# Patient Record
Sex: Female | Born: 1990 | Race: Black or African American | Hispanic: No | Marital: Single | Smoking: Never smoker
Health system: Southern US, Community
[De-identification: ages and names within clinical notes are randomized; demographics above are authoritative.]

## PROBLEM LIST (undated history)

## (undated) DIAGNOSIS — F32A Depression, unspecified: Secondary | ICD-10-CM

## (undated) DIAGNOSIS — N83209 Unspecified ovarian cyst, unspecified side: Secondary | ICD-10-CM

## (undated) DIAGNOSIS — K219 Gastro-esophageal reflux disease without esophagitis: Secondary | ICD-10-CM

## (undated) DIAGNOSIS — F419 Anxiety disorder, unspecified: Secondary | ICD-10-CM

## (undated) DIAGNOSIS — R87619 Unspecified abnormal cytological findings in specimens from cervix uteri: Secondary | ICD-10-CM

## (undated) DIAGNOSIS — D649 Anemia, unspecified: Secondary | ICD-10-CM

## (undated) DIAGNOSIS — J45909 Unspecified asthma, uncomplicated: Secondary | ICD-10-CM

## (undated) HISTORY — DX: Unspecified ovarian cyst, unspecified side: N83.209

## (undated) HISTORY — DX: Anemia, unspecified: D64.9

## (undated) HISTORY — PX: CHOLECYSTECTOMY: SHX55

## (undated) HISTORY — DX: Anxiety disorder, unspecified: F41.9

## (undated) HISTORY — PX: COLPOSCOPY: SHX161

## (undated) HISTORY — PX: TUBAL LIGATION: SHX77

## (undated) HISTORY — DX: Depression, unspecified: F32.A

## (undated) HISTORY — DX: Unspecified asthma, uncomplicated: J45.909

## (undated) HISTORY — DX: Unspecified abnormal cytological findings in specimens from cervix uteri: R87.619

---

## 2012-01-16 ENCOUNTER — Other Ambulatory Visit: Payer: Self-pay

## 2012-01-29 ENCOUNTER — Other Ambulatory Visit (HOSPITAL_COMMUNITY): Payer: Self-pay | Admitting: Obstetrics and Gynecology

## 2012-01-29 DIAGNOSIS — Z3689 Encounter for other specified antenatal screening: Secondary | ICD-10-CM

## 2012-01-29 DIAGNOSIS — Z3682 Encounter for antenatal screening for nuchal translucency: Secondary | ICD-10-CM

## 2012-02-01 ENCOUNTER — Encounter (HOSPITAL_COMMUNITY): Payer: Self-pay | Admitting: Obstetrics and Gynecology

## 2012-02-15 ENCOUNTER — Encounter (HOSPITAL_COMMUNITY): Payer: Self-pay

## 2012-02-15 ENCOUNTER — Ambulatory Visit (HOSPITAL_COMMUNITY)
Admission: RE | Admit: 2012-02-15 | Discharge: 2012-02-15 | Disposition: A | Discharge status: Home / Self Care | Payer: 59 | Source: Ambulatory Visit | Attending: Obstetrics and Gynecology | Admitting: Obstetrics and Gynecology

## 2012-02-15 ENCOUNTER — Other Ambulatory Visit: Payer: Self-pay

## 2012-02-15 ENCOUNTER — Other Ambulatory Visit (HOSPITAL_COMMUNITY): Payer: Self-pay

## 2012-02-15 VITALS — BP 123/72 | HR 85 | Wt 236.2 lb

## 2012-02-15 DIAGNOSIS — O3510X0 Maternal care for (suspected) chromosomal abnormality in fetus, unspecified, not applicable or unspecified: Secondary | ICD-10-CM | POA: Insufficient documentation

## 2012-02-15 DIAGNOSIS — O351XX0 Maternal care for (suspected) chromosomal abnormality in fetus, not applicable or unspecified: Secondary | ICD-10-CM | POA: Insufficient documentation

## 2012-02-15 DIAGNOSIS — Z3689 Encounter for other specified antenatal screening: Secondary | ICD-10-CM | POA: Insufficient documentation

## 2012-02-15 DIAGNOSIS — Z1389 Encounter for screening for other disorder: Secondary | ICD-10-CM

## 2012-02-15 DIAGNOSIS — Z3682 Encounter for antenatal screening for nuchal translucency: Secondary | ICD-10-CM

## 2012-02-15 DIAGNOSIS — O352XX0 Maternal care for (suspected) hereditary disease in fetus, not applicable or unspecified: Secondary | ICD-10-CM | POA: Insufficient documentation

## 2012-02-15 NOTE — Progress Notes (Signed)
Patient seen today  for ultrasound appointment.  See full report in [facility].  Alpha Gula, MD  Single IUP at 12 4/7 weels First trimester screen performed NT of 1.6 mm noted.  Nasal bone visualied.  Recommend follow up ultrasound in 6 weeks for detailed anatomy scan.

## 2012-02-16 ENCOUNTER — Encounter (HOSPITAL_COMMUNITY): Payer: Self-pay

## 2012-02-16 NOTE — Progress Notes (Signed)
Genetic Counseling  High-Risk Gestation Note  Appointment Date:  02/15/2012 Referred By: Victoria Hamming, MD Date of Birth:  September 14, 1990    Pregnancy History: G1P0 Estimated Date of Delivery: 08/25/12 Estimated Gestational Age: [redacted]w[redacted]d Attending: Alpha Gula, MD  I met with Ms. Victoria Smith and her partner, Mr. Victoria Smith, for genetic counseling because of a family history of Down syndrome.  We began by reviewing both family histories in detail. Ms. Victoria Smith reported a brother with Down syndrome. She reported that he is in his 41s, is a slow Advice worker and lives with their father. She reported that he has no physical differences compared to relatives and does not have facial features characteristic of Down syndrome. Ms. Victoria Smith reported that he possibly had surgery in the past due to a tumor in his ear, but he is otherwise healthy. She reported that her father also has somewhat slower mental functioning describing that he often repeats things during conversations. We discussed that these reported features do not fit the typical characteristics of individuals with Down syndrome. Thus, we discussed that Ms. Victoria Smith's brother may have mental retardation due to a different underlying cause.   We discussed that 95% of cases of Down syndrome are not inherited and are the result of non-disjunction.  Three to 4% of cases of Down syndrome are the result of a translocation involving chromosome #21.  We discussed the option of chromosome analysis to determine if an individual is a carrier of a balanced translocation involving chromosome #21.  If an individual carries a balanced translocation involving chromosome #21, then the chance to have a baby with Down syndrome would be greater than the maternal age-related risk. If Ms. Victoria Smith's brother has Down syndrome, then it is most likely sporadic, which would not increase recurrence risk for the current pregnancy. However, additional information regarding his karyotype  analysis or karyotype analysis for the patient would further refine recurrence risk assessment. We reviewed that in the case that Ms. Victoria Smith's brother does not have Down syndrome but mental retardation due to a different etiology, then the current pregnancy would not be at increased risk for Down syndrome given the patient's age alone.   We discussed that there are many different causes of mental retardation such as genetic differences, sporadic causes, or injuries.  A specific diagnosis for mental retardation can be determined in approximately 50% of cases.  In the remaining 50% of cases, a diagnosis may not ever be determined.  Without more specific information, potential genetic risks cannot be determined.  We offered to review medical records for her brother regarding the specific diagnosis and underlying etiology, if known, in order to more accurate assess recurrence risk for the current pregnancy. If an underlying etiology has not been determined for her brother's features, a medical genetics evaluation would be available to him to assess for an etiology and assess recurrence risk for relatives. The patient understands that in the absence of a confirmed underlying genetic cause, prenatal testing would not be available.   We reviewed available screening options for Down syndrome.  Regarding screening tests, we discussed the options of First screen and ultrasound.  She understands that screening tests are used to modify a patient's a priori risk for aneuploidy, typically based on age.  This estimate provides a pregnancy specific risk assessment.  We discussed the risks, limitations, and benefits of each.  After reviewing these options, Ms. Victoria Smith elected to have First Trimester screening at the time of today's visit. Complete ultrasound results  reported separately.   She understands that ultrasound cannot rule out all birth defects or genetic syndromes.   Ms. Victoria Smith also reported a female maternal  first cousin who is mentally slower than relatives due to a history of seizures. An underlying cause for her seizures was not known by the patient at the time of today's visit. Epilepsy occurs in approximately 1% of the population and can have many causes.  Approximately 80% of epilepsy is thought to be idiopathic while the remaining 20% is secondary to a variety of factors such as perinatal events, infections, trauma and genetic disease.  A specific diagnosis in an affected individual is necessary to accurately assess the risk for other family members to develop epilepsy.  In the absence of a known etiology, epilepsy is thought to be caused by a combination of genetic and environmental factors, called multifactorial inheritance. In the case of multifactorial inheritance, recurrence risk for epilepsy would not likely be increased above the general population risk for the current pregnancy given the degree of relation. Without further information regarding the provided family history, an accurate genetic risk cannot be calculated. Further genetic counseling is warranted if more information is obtained.  Ms. Victoria Smith was provided with written information regarding sickle cell anemia (SCA) including the carrier frequency and incidence in the African-American population, the availability of carrier testing and prenatal diagnosis if indicated.  In addition, we discussed that hemoglobinopathies are routinely screened for as part of the [place] newborn screening panel.  Her OB medical records indicated that hemoglobin electrophoresis was performed on 01/17/12. Results were not available at the time of the patient's visit.    Ms. Victoria Smith denied exposure to environmental toxins or chemical agents. She denied the use of alcohol, tobacco or street drugs. She denied significant viral illnesses during the course of her pregnancy. Her medical and surgical histories were noncontributory.   I counseled this couple  regarding the above risks and available options.  The approximate face-to-face time with the genetic counselor was 25 minutes.  Quinn Plowman, MS Certified Genetic Counselor 02/16/2012

## 2012-04-01 ENCOUNTER — Ambulatory Visit (HOSPITAL_COMMUNITY)
Admission: RE | Admit: 2012-04-01 | Discharge: 2012-04-01 | Disposition: A | Discharge status: Home / Self Care | Payer: 59 | Source: Ambulatory Visit | Attending: Obstetrics and Gynecology | Admitting: Obstetrics and Gynecology

## 2012-04-01 VITALS — BP 122/70 | HR 92 | Wt 237.0 lb

## 2012-04-01 DIAGNOSIS — O358XX0 Maternal care for other (suspected) fetal abnormality and damage, not applicable or unspecified: Secondary | ICD-10-CM | POA: Insufficient documentation

## 2012-04-01 DIAGNOSIS — Z1389 Encounter for screening for other disorder: Secondary | ICD-10-CM | POA: Insufficient documentation

## 2012-04-01 DIAGNOSIS — Z3689 Encounter for other specified antenatal screening: Secondary | ICD-10-CM

## 2012-04-01 DIAGNOSIS — E669 Obesity, unspecified: Secondary | ICD-10-CM | POA: Insufficient documentation

## 2012-04-01 DIAGNOSIS — Z363 Encounter for antenatal screening for malformations: Secondary | ICD-10-CM | POA: Insufficient documentation

## 2012-04-01 NOTE — Progress Notes (Signed)
Victoria Smith  was seen today for an ultrasound appointment.  See full report in [facility].  Alpha Gula, MD  Single IUP at 19 1/7 weeks Normal anatomic fetal survey; however, limited views of the fetal heart and face were obtained No markers associated with Down syndrome noted Normal amniotic fluid volume  Recommend follow up ultrasound in 4 weeks to reevaluate the fetal heart and face.

## 2012-04-29 ENCOUNTER — Ambulatory Visit (HOSPITAL_COMMUNITY)
Admission: RE | Admit: 2012-04-29 | Discharge: 2012-04-29 | Disposition: A | Discharge status: Home / Self Care | Payer: 59 | Source: Ambulatory Visit | Attending: Obstetrics and Gynecology | Admitting: Obstetrics and Gynecology

## 2012-04-29 DIAGNOSIS — O358XX0 Maternal care for other (suspected) fetal abnormality and damage, not applicable or unspecified: Secondary | ICD-10-CM | POA: Insufficient documentation

## 2012-04-29 DIAGNOSIS — O99210 Obesity complicating pregnancy, unspecified trimester: Secondary | ICD-10-CM | POA: Insufficient documentation

## 2012-04-29 DIAGNOSIS — E669 Obesity, unspecified: Secondary | ICD-10-CM | POA: Insufficient documentation

## 2012-04-29 DIAGNOSIS — Z1389 Encounter for screening for other disorder: Secondary | ICD-10-CM | POA: Insufficient documentation

## 2012-04-29 DIAGNOSIS — Z363 Encounter for antenatal screening for malformations: Secondary | ICD-10-CM | POA: Insufficient documentation

## 2012-04-29 DIAGNOSIS — Z3689 Encounter for other specified antenatal screening: Secondary | ICD-10-CM

## 2012-04-29 DIAGNOSIS — O352XX0 Maternal care for (suspected) hereditary disease in fetus, not applicable or unspecified: Secondary | ICD-10-CM | POA: Insufficient documentation

## 2012-04-29 NOTE — Progress Notes (Signed)
Ms. Hinger had an ultrasound appointment today.  Please see [facility] report for details.  Comments There is an active singleton fetus with no apparent dysmorphic features on today's routine anatomic re-examination.  The biometry suggests a fetus with an EFW at the approximately 42nd percentile for gestational age.    Impression Active singleton fetus. Normal interval growth. Normal amniotic fluid volume Exam is limited due to fetal position and maternal insonating characteristics Views of the fetal heart (ductal arch and aortic arch), diaphram, and face are suboptimal, warranting follow up evaluation   Recommendations 1. Repeat interval growth assessment and follow up anatomy to complete our survey of the fetal anatomy by ultrasound was scheduled for your patient in 4 weeks. 2. Follow as clinically indicated.  Rogelia Boga, MD, MS, FACOG Assistant Professor Section of Maternal-Fetal Medicine [facility]

## 2012-05-27 ENCOUNTER — Other Ambulatory Visit (HOSPITAL_COMMUNITY): Payer: Self-pay | Admitting: Obstetrics and Gynecology

## 2012-05-27 DIAGNOSIS — O352XX0 Maternal care for (suspected) hereditary disease in fetus, not applicable or unspecified: Secondary | ICD-10-CM

## 2012-05-28 ENCOUNTER — Ambulatory Visit (HOSPITAL_COMMUNITY)
Admission: RE | Admit: 2012-05-28 | Discharge: 2012-05-28 | Disposition: A | Discharge status: Home / Self Care | Payer: 59 | Source: Ambulatory Visit | Attending: Obstetrics and Gynecology | Admitting: Obstetrics and Gynecology

## 2012-05-28 DIAGNOSIS — O99210 Obesity complicating pregnancy, unspecified trimester: Secondary | ICD-10-CM | POA: Insufficient documentation

## 2012-05-28 DIAGNOSIS — E669 Obesity, unspecified: Secondary | ICD-10-CM | POA: Insufficient documentation

## 2012-05-28 DIAGNOSIS — O352XX0 Maternal care for (suspected) hereditary disease in fetus, not applicable or unspecified: Secondary | ICD-10-CM | POA: Insufficient documentation

## 2012-05-28 NOTE — Progress Notes (Signed)
Victoria Smith  was seen today for an ultrasound appointment.  See full report in [facility].  Alpha Gula, MD  Single IUP at 27 2/7 weeks Normal interval anatomy - the fetal survey is now complete Interval growth is appropriate (48th %tile) Normal amniotic fluid volume  Recommend follow up ultrasounds as clinically indicated

## 2014-05-22 ENCOUNTER — Emergency Department (HOSPITAL_BASED_OUTPATIENT_CLINIC_OR_DEPARTMENT_OTHER)
Admission: EM | Admit: 2014-05-22 | Discharge: 2014-05-22 | Disposition: A | Discharge status: Home / Self Care | Payer: Medicaid Other | Attending: Emergency Medicine | Admitting: Emergency Medicine

## 2014-05-22 ENCOUNTER — Emergency Department (HOSPITAL_BASED_OUTPATIENT_CLINIC_OR_DEPARTMENT_OTHER): Payer: Medicaid Other

## 2014-05-22 ENCOUNTER — Encounter (HOSPITAL_BASED_OUTPATIENT_CLINIC_OR_DEPARTMENT_OTHER): Payer: Self-pay | Admitting: Emergency Medicine

## 2014-05-22 DIAGNOSIS — F17200 Nicotine dependence, unspecified, uncomplicated: Secondary | ICD-10-CM | POA: Insufficient documentation

## 2014-05-22 DIAGNOSIS — Z3A08 8 weeks gestation of pregnancy: Secondary | ICD-10-CM | POA: Diagnosis not present

## 2014-05-22 DIAGNOSIS — Z3490 Encounter for supervision of normal pregnancy, unspecified, unspecified trimester: Secondary | ICD-10-CM

## 2014-05-22 DIAGNOSIS — O26891 Other specified pregnancy related conditions, first trimester: Secondary | ICD-10-CM | POA: Diagnosis present

## 2014-05-22 DIAGNOSIS — O99331 Smoking (tobacco) complicating pregnancy, first trimester: Secondary | ICD-10-CM | POA: Diagnosis not present

## 2014-05-22 DIAGNOSIS — N3000 Acute cystitis without hematuria: Secondary | ICD-10-CM

## 2014-05-22 DIAGNOSIS — O2311 Infections of bladder in pregnancy, first trimester: Secondary | ICD-10-CM | POA: Insufficient documentation

## 2014-05-22 LAB — URINALYSIS, ROUTINE W REFLEX MICROSCOPIC
Bilirubin Urine: NEGATIVE
Glucose, UA: NEGATIVE mg/dL
KETONES UR: NEGATIVE mg/dL
Nitrite: NEGATIVE
PROTEIN: NEGATIVE mg/dL
Specific Gravity, Urine: 1.024 (ref 1.005–1.030)
Urobilinogen, UA: 1 mg/dL (ref 0.0–1.0)
pH: 6.5 (ref 5.0–8.0)

## 2014-05-22 LAB — URINE MICROSCOPIC-ADD ON

## 2014-05-22 LAB — HCG, QUANTITATIVE, PREGNANCY: hCG, Beta Chain, Quant, S: 117730 m[IU]/mL — ABNORMAL HIGH (ref <5)

## 2014-05-22 LAB — PREGNANCY, URINE: Preg Test, Ur: POSITIVE — AB

## 2014-05-22 LAB — CBC
HCT: 34.7 % — ABNORMAL LOW (ref 36.0–46.0)
Hemoglobin: 11.8 g/dL — ABNORMAL LOW (ref 12.0–15.0)
MCH: 29.1 pg (ref 26.0–34.0)
MCHC: 34 g/dL (ref 30.0–36.0)
MCV: 85.7 fL (ref 78.0–100.0)
Platelets: 311 K/uL (ref 150–400)
RBC: 4.05 MIL/uL (ref 3.87–5.11)
RDW: 12.8 % (ref 11.5–15.5)
WBC: 6.8 K/uL (ref 4.0–10.5)

## 2014-05-22 LAB — ABO/RH: ABO/RH(D): B POS

## 2014-05-22 MED ORDER — AMOXICILLIN-POT CLAVULANATE 500-125 MG PO TABS: 1.0000 | ORAL_TABLET | Freq: Three times a day (TID) | ORAL | Status: DC

## 2014-05-22 MED ORDER — PRENATAL VITAMIN 27-0.8 MG PO TABS: 1.0000 | ORAL_TABLET | Freq: Every day | ORAL | Status: DC

## 2014-05-22 NOTE — ED Notes (Signed)
Pt returned from US

## 2014-05-22 NOTE — ED Notes (Signed)
MD at bedside. 

## 2014-05-22 NOTE — ED Provider Notes (Signed)
CSN: [id number]     Arrival date & time 05/22/14  0730 History   First MD Initiated Contact with Patient 05/22/14 [phone]     Chief Complaint  Patient presents with  . Abdominal Pain     (Consider location/radiation/quality/duration/timing/severity/associated sxs/prior Treatment) HPI Pt presenting with c/o lower abdominal pain, burning with urination, urgency and frequency.  No fever/chills.  She has also been having ongoing nausea.  She is approx [redacted] weeks pregnant based on her LMP.  She also states this morning she had some bright red blood from vagina which she saw on toilet tissue and on toilet seat.  No blood clots or passage of tissue.  No back pain.  Pt had hx of UTI during pregnancy.  She is G2P1.  No other complications of prior pregnancy.  There are no other associated systemic symptoms, there are no other alleviating or modifying factors.   History reviewed. No pertinent past medical history. Past Surgical History  Procedure Laterality Date  . Cesarean section     Family History  Problem Relation Age of Onset  . Cancer Mother   . Mental retardation Brother    History  Substance Use Topics  . Smoking status: Current Every Day Smoker  . Smokeless tobacco: Not on file  . Alcohol Use: No   OB History   Grav Para Term Preterm Abortions TAB SAB Ect Mult Living   1              Review of Systems ROS reviewed and all otherwise negative except for mentioned in HPI    Allergies  Review of patient's allergies indicates no known allergies.  Home Medications   Prior to Admission medications   Medication Sig Start Date End Date Taking? Authorizing Provider  Prenatal Vit-Fe Fumarate-FA (PRENATAL MULTIVITAMIN) TABS tablet Take 1 tablet by mouth daily at 12 noon.   Yes Historical Provider, MD  amoxicillin-clavulanate (AUGMENTIN) 500-125 MG per tablet Take 1 tablet (500 mg total) by mouth every 8 (eight) hours. 05/22/14   Ethelda ChickMartha K Linker, MD  ondansetron (ZOFRAN) 4 MG tablet   04/18/12   Historical Provider, MD  Prenatal Vit-Fe Fumarate-FA (PRENATAL VITAMIN) 27-0.8 MG TABS Take 1 tablet by mouth daily. 05/22/14   Ethelda ChickMartha K Linker, MD   BP 128/79  Pulse 76  Temp(Src) 98.2 F (36.8 C) (Oral)  Resp 18  Wt 240 lb (108.863 kg)  SpO2 100% Vitals reviewed Physical Exam Physical Examination: General appearance - alert, well appearing, and in no distress Mental status - alert, oriented to person, place, and time Eyes - no conjunctival injection, no scleral icterus Mouth - mucous membranes moist, pharynx normal without lesions Chest - clear to auscultation, no wheezes, rales or rhonchi, symmetric air entry Heart - normal rate, regular rhythm, normal S1, S2, no murmurs, rubs, clicks or gallops Abdomen - soft, nontender, nondistended, no masses or organomegaly Extremities - peripheral pulses normal, no pedal edema, no clubbing or cyanosis Skin - normal coloration and turgor, no rashes  ED Course  Procedures (including critical care time) Labs Review Labs Reviewed  URINALYSIS, ROUTINE W REFLEX MICROSCOPIC - Abnormal; Notable for the following:    APPearance CLOUDY (*)    Hgb urine dipstick MODERATE (*)    Leukocytes, UA MODERATE (*)    All other components within normal limits  PREGNANCY, URINE - Abnormal; Notable for the following:    Preg Test, Ur POSITIVE (*)    All other components within normal limits  HCG, QUANTITATIVE, PREGNANCY - Abnormal;  Notable for the following:    hCG, Beta Chain, Quant, [place] [id number] (*)    All other components within normal limits  CBC - Abnormal; Notable for the following:    Hemoglobin 11.8 (*)    HCT 34.7 (*)    All other components within normal limits  URINE MICROSCOPIC-ADD ON - Abnormal; Notable for the following:    Squamous Epithelial / LPF MANY (*)    Bacteria, UA MANY (*)    All other components within normal limits  ABO/RH    Imaging Review No results found.   EKG Interpretation None      MDM   Final  diagnoses:  Acute cystitis without hematuria  Pregnancy   Pt presents with symptoms of UTI- urine shows + pregnancy and is c/w uti.  Will treat with augmentin based on UTD recommendations.  Pelvic us shows + IUP at 8 weeks and 4 days, with HR 178.  She rh+ so no requirement for rhogam.  Pt encouraged to followup with OB, given rx for prenatal vitamins as well.  Discharged with strict return precautions.  Pt agreeable with plan.    Ethelda ChickMartha K Linker, MD 05/25/14 [phone]

## 2014-05-22 NOTE — ED Notes (Signed)
Pt sts she is [redacted] weeks pregnant and woke up this morning with lower abd pain. She sts that when she went to the bathroom, her urine was cloudy. Pt sts that she had UTI's with last preg.

## 2014-05-22 NOTE — ED Notes (Signed)
Patient transported to Ultrasound 

## 2014-05-22 NOTE — Discharge Instructions (Signed)
Return to the ED with any concerns including worsening abdominal pain, bleeding and soaking more than one pad per hour, fever/chills, vomiting and not able to keep down liquids or antibiotics, decreased level of alertness/lethargy, or any other alarming symptoms  The ultrasound performed today places your pregnancy at 8 weeks, 4 days

## 2014-06-01 ENCOUNTER — Encounter (HOSPITAL_BASED_OUTPATIENT_CLINIC_OR_DEPARTMENT_OTHER): Payer: Self-pay | Admitting: Emergency Medicine

## 2014-06-01 ENCOUNTER — Emergency Department (HOSPITAL_BASED_OUTPATIENT_CLINIC_OR_DEPARTMENT_OTHER)
Admission: EM | Admit: 2014-06-01 | Discharge: 2014-06-01 | Disposition: A | Discharge status: Home / Self Care | Payer: Medicaid Other | Attending: Emergency Medicine | Admitting: Emergency Medicine

## 2014-06-01 DIAGNOSIS — F17210 Nicotine dependence, cigarettes, uncomplicated: Secondary | ICD-10-CM | POA: Diagnosis not present

## 2014-06-01 DIAGNOSIS — O99331 Smoking (tobacco) complicating pregnancy, first trimester: Secondary | ICD-10-CM | POA: Insufficient documentation

## 2014-06-01 DIAGNOSIS — B373 Candidiasis of vulva and vagina: Secondary | ICD-10-CM | POA: Insufficient documentation

## 2014-06-01 DIAGNOSIS — Z3A10 10 weeks gestation of pregnancy: Secondary | ICD-10-CM | POA: Insufficient documentation

## 2014-06-01 DIAGNOSIS — Z792 Long term (current) use of antibiotics: Secondary | ICD-10-CM | POA: Insufficient documentation

## 2014-06-01 DIAGNOSIS — O2391 Unspecified genitourinary tract infection in pregnancy, first trimester: Secondary | ICD-10-CM | POA: Insufficient documentation

## 2014-06-01 DIAGNOSIS — Z3491 Encounter for supervision of normal pregnancy, unspecified, first trimester: Secondary | ICD-10-CM

## 2014-06-01 DIAGNOSIS — B3731 Acute candidiasis of vulva and vagina: Secondary | ICD-10-CM

## 2014-06-01 LAB — URINALYSIS, ROUTINE W REFLEX MICROSCOPIC
Bilirubin Urine: NEGATIVE
Glucose, UA: NEGATIVE mg/dL
Hgb urine dipstick: NEGATIVE
Ketones, ur: NEGATIVE mg/dL
NITRITE: NEGATIVE
PROTEIN: NEGATIVE mg/dL
SPECIFIC GRAVITY, URINE: 1.028 (ref 1.005–1.030)
UROBILINOGEN UA: 1 mg/dL (ref 0.0–1.0)
pH: 7 (ref 5.0–8.0)

## 2014-06-01 LAB — URINE MICROSCOPIC-ADD ON

## 2014-06-01 LAB — WET PREP, GENITAL
Trich, Wet Prep: NONE SEEN
Yeast Wet Prep HPF POC: NONE SEEN

## 2014-06-01 LAB — PREGNANCY, URINE: Preg Test, Ur: POSITIVE — AB

## 2014-06-01 MED ORDER — FLUCONAZOLE 200 MG PO TABS: 200.0000 mg | ORAL_TABLET | Freq: Every day | ORAL | Status: AC

## 2014-06-01 NOTE — ED Notes (Signed)
Here last week and treated for UTI. White discharge and itching after taking the antibiotic.

## 2014-06-01 NOTE — ED Provider Notes (Signed)
CSN: [id number]     Arrival date & time 06/01/14  1132 History   First MD Initiated Contact with Patient 06/01/14 1149     Chief Complaint  Patient presents with  . Vaginal Discharge     (Consider location/radiation/quality/duration/timing/severity/associated sxs/prior Treatment) Patient is a 23 y.o. female presenting with vaginal discharge. The history is provided by the patient.  Vaginal Discharge Quality:  White and thick Severity:  Severe Onset quality:  Gradual Duration:  1 week Timing:  Constant Progression:  Worsening Chronicity:  New Context: recent antibiotic use   Relieved by:  None tried Worsened by:  Nothing tried Ineffective treatments:  None tried Associated symptoms: vaginal itching   Associated symptoms: no abdominal pain, no dysuria, no genital lesions, no nausea and no urinary frequency   Associated symptoms comment:  Vaginal burning Risk factors: no new sexual partner and no STI exposure   Risk factors comment:  Currently pregnant   History reviewed. No pertinent past medical history. Past Surgical History  Procedure Laterality Date  . Cesarean section     Family History  Problem Relation Age of Onset  . Cancer Mother   . Mental retardation Brother    History  Substance Use Topics  . Smoking status: Current Every Day Smoker  . Smokeless tobacco: Not on file  . Alcohol Use: No   OB History   Grav Para Term Preterm Abortions TAB SAB Ect Mult Living   1              Review of Systems  Gastrointestinal: Negative for nausea and abdominal pain.  Genitourinary: Positive for vaginal discharge. Negative for dysuria.  All other systems reviewed and are negative.     Allergies  Review of patient's allergies indicates no known allergies.  Home Medications   Prior to Admission medications   Medication Sig Start Date End Date Taking? Authorizing Provider  amoxicillin-clavulanate (AUGMENTIN) 500-125 MG per tablet Take 1 tablet (500 mg total) by  mouth every 8 (eight) hours. 05/22/14   Ethelda ChickMartha K Linker, MD  ondansetron (ZOFRAN) 4 MG tablet  04/18/12   Historical Provider, MD  Prenatal Vit-Fe Fumarate-FA (PRENATAL MULTIVITAMIN) TABS tablet Take 1 tablet by mouth daily at 12 noon.    Historical Provider, MD  Prenatal Vit-Fe Fumarate-FA (PRENATAL VITAMIN) 27-0.8 MG TABS Take 1 tablet by mouth daily. 05/22/14   Ethelda ChickMartha K Linker, MD   BP 124/70  Pulse 80  Temp(Src) 98 F (36.7 C) (Oral)  Resp 18  Ht 5\' 3"  (1.6 m)  Wt 240 lb (108.863 kg)  BMI 42.52 kg/m2  SpO2 100% Physical Exam  Nursing note and vitals reviewed. Constitutional: She is oriented to person, place, and time. She appears well-developed and well-nourished. No distress.  HENT:  Head: Normocephalic and atraumatic.  Eyes: EOM are normal. Pupils are equal, round, and reactive to light.  Cardiovascular: Normal rate.   Pulmonary/Chest: Effort normal.  Abdominal: Soft. She exhibits no distension. There is no tenderness. There is no rebound.  Genitourinary: There is no tenderness on the right labia. There is no tenderness on the left labia. Uterus is enlarged. Uterus is not tender. Cervix exhibits no motion tenderness and no friability. Right adnexum displays no mass and no tenderness. Left adnexum displays no mass and no tenderness. Vaginal discharge found.  Copious amounts of curd-like vaginal discharge  Neurological: She is alert and oriented to person, place, and time.  Skin: Skin is warm and dry.  Psychiatric: She has a normal mood and affect.  Her behavior is normal.    ED Course  Procedures (including critical care time) Labs Review Labs Reviewed  WET PREP, GENITAL - Abnormal; Notable for the following:    Clue Cells Wet Prep HPF POC FEW (*)    WBC, Wet Prep HPF POC TOO NUMEROUS TO COUNT (*)    All other components within normal limits  URINALYSIS, ROUTINE W REFLEX MICROSCOPIC - Abnormal; Notable for the following:    Leukocytes, UA SMALL (*)    All other components  within normal limits  PREGNANCY, URINE - Abnormal; Notable for the following:    Preg Test, Ur POSITIVE (*)    All other components within normal limits  URINE MICROSCOPIC-ADD ON - Abnormal; Notable for the following:    Squamous Epithelial / LPF FEW (*)    Bacteria, UA MANY (*)    All other components within normal limits  GC/CHLAMYDIA PROBE AMP    Imaging Review No results found.   EKG Interpretation None      MDM   Final diagnoses:  Yeast infection involving the vagina and surrounding area  Pregnant and not yet delivered, first trimester    Patient with vaginal discharge for approximately 1 week after's taking an antibiotic for a urinary tract infection. It itches and burns and is consistent with a yeast infection on physical exam. Patient states that she is pregnant and her last menstrual period was in August. She is not currently started prenatal care because she is waiting for her insurance. She denies any vaginal bleeding, abdominal pain or other concerns. UA without signs of new infection. Pelvic exam consistent with a yeast infection. Patient counseled to use Monistat as well as was given 3 days of Diflucan.  12:56 PM Bedside US showed IUP around 10-11 weeks with cardiac activity.  Gwyneth SproutWhitney Magdala Brahmbhatt, MD 06/01/14 1257

## 2014-06-02 LAB — GC/CHLAMYDIA PROBE AMP
CT PROBE, AMP APTIMA: NEGATIVE
GC Probe RNA: NEGATIVE

## 2014-06-08 ENCOUNTER — Encounter (HOSPITAL_BASED_OUTPATIENT_CLINIC_OR_DEPARTMENT_OTHER): Payer: Self-pay | Admitting: Emergency Medicine

## 2015-06-30 ENCOUNTER — Emergency Department (HOSPITAL_BASED_OUTPATIENT_CLINIC_OR_DEPARTMENT_OTHER)
Admission: EM | Admit: 2015-06-30 | Discharge: 2015-06-30 | Disposition: A | Discharge status: Home / Self Care | Payer: Medicaid Other | Attending: Emergency Medicine | Admitting: Emergency Medicine

## 2015-06-30 ENCOUNTER — Encounter (HOSPITAL_BASED_OUTPATIENT_CLINIC_OR_DEPARTMENT_OTHER): Payer: Self-pay | Admitting: Emergency Medicine

## 2015-06-30 ENCOUNTER — Emergency Department (HOSPITAL_BASED_OUTPATIENT_CLINIC_OR_DEPARTMENT_OTHER): Payer: Medicaid Other

## 2015-06-30 DIAGNOSIS — K219 Gastro-esophageal reflux disease without esophagitis: Secondary | ICD-10-CM | POA: Diagnosis not present

## 2015-06-30 DIAGNOSIS — R112 Nausea with vomiting, unspecified: Secondary | ICD-10-CM

## 2015-06-30 DIAGNOSIS — F17200 Nicotine dependence, unspecified, uncomplicated: Secondary | ICD-10-CM | POA: Insufficient documentation

## 2015-06-30 DIAGNOSIS — Z3202 Encounter for pregnancy test, result negative: Secondary | ICD-10-CM | POA: Insufficient documentation

## 2015-06-30 DIAGNOSIS — Z9889 Other specified postprocedural states: Secondary | ICD-10-CM | POA: Diagnosis not present

## 2015-06-30 DIAGNOSIS — R1084 Generalized abdominal pain: Secondary | ICD-10-CM | POA: Diagnosis present

## 2015-06-30 LAB — CBC WITH DIFFERENTIAL/PLATELET
Basophils Absolute: 0 10*3/uL (ref 0.0–0.1)
Basophils Relative: 0 %
Eosinophils Absolute: 0 10*3/uL (ref 0.0–0.7)
Eosinophils Relative: 0 %
HEMATOCRIT: 36.7 % (ref 36.0–46.0)
HEMOGLOBIN: 12.6 g/dL (ref 12.0–15.0)
LYMPHS ABS: 1 10*3/uL (ref 0.7–4.0)
Lymphocytes Relative: 12 %
MCH: 28.8 pg (ref 26.0–34.0)
MCHC: 34.3 g/dL (ref 30.0–36.0)
MCV: 83.8 fL (ref 78.0–100.0)
Monocytes Absolute: 0.1 10*3/uL (ref 0.1–1.0)
Monocytes Relative: 2 %
NEUTROS PCT: 86 %
Neutro Abs: 7.5 10*3/uL (ref 1.7–7.7)
Platelets: 298 10*3/uL (ref 150–400)
RBC: 4.38 MIL/uL (ref 3.87–5.11)
RDW: 12.4 % (ref 11.5–15.5)
WBC: 8.6 10*3/uL (ref 4.0–10.5)

## 2015-06-30 LAB — URINALYSIS, ROUTINE W REFLEX MICROSCOPIC
BILIRUBIN URINE: NEGATIVE
GLUCOSE, UA: NEGATIVE mg/dL
Hgb urine dipstick: NEGATIVE
Ketones, ur: >80 mg/dL — AB
Nitrite: NEGATIVE
Protein, ur: 100 mg/dL — AB
SPECIFIC GRAVITY, URINE: 1.029 (ref 1.005–1.030)
pH: 8.5 — ABNORMAL HIGH (ref 5.0–8.0)

## 2015-06-30 LAB — COMPREHENSIVE METABOLIC PANEL
ALBUMIN: 4 g/dL (ref 3.5–5.0)
ALT: 19 U/L (ref 14–54)
AST: 27 U/L (ref 15–41)
Alkaline Phosphatase: 63 U/L (ref 38–126)
Anion gap: 12 (ref 5–15)
BUN: 9 mg/dL (ref 6–20)
CO2: 19 mmol/L — AB (ref 22–32)
Calcium: 9.7 mg/dL (ref 8.9–10.3)
Chloride: 106 mmol/L (ref 101–111)
Creatinine, Ser: 0.76 mg/dL (ref 0.44–1.00)
GFR calc non Af Amer: >60 mL/min (ref >60)
Glucose, Bld: 111 mg/dL — ABNORMAL HIGH (ref 65–99)
Potassium: 3.4 mmol/L — ABNORMAL LOW (ref 3.5–5.1)
SODIUM: 137 mmol/L (ref 135–145)
Total Bilirubin: 0.6 mg/dL (ref 0.3–1.2)
Total Protein: 8.4 g/dL — ABNORMAL HIGH (ref 6.5–8.1)

## 2015-06-30 LAB — URINE MICROSCOPIC-ADD ON

## 2015-06-30 LAB — PREGNANCY, URINE: Preg Test, Ur: NEGATIVE

## 2015-06-30 LAB — LIPASE, BLOOD: Lipase: 34 U/L (ref 11–51)

## 2015-06-30 MED ORDER — IOHEXOL 300 MG/ML  SOLN
100.0000 mL | Freq: Once | INTRAMUSCULAR | Status: AC | PRN
  Administered 2015-06-30: 100 mL via INTRAVENOUS

## 2015-06-30 MED ORDER — FAMOTIDINE 20 MG PO TABS
40.0000 mg | ORAL_TABLET | Freq: Every day | ORAL | Status: DC
Start: 2015-06-30 — End: 2017-11-19

## 2015-06-30 MED ORDER — GI COCKTAIL ~~LOC~~
30.0000 mL | Freq: Once | ORAL | Status: AC
Start: 2015-06-30 — End: 2015-06-30
  Administered 2015-06-30: 30 mL via ORAL
  Filled 2015-06-30: qty 30

## 2015-06-30 MED ORDER — PANTOPRAZOLE SODIUM 40 MG IV SOLR
40.0000 mg | Freq: Once | INTRAVENOUS | Status: AC
  Administered 2015-06-30: 40 mg via INTRAVENOUS
  Filled 2015-06-30: qty 40

## 2015-06-30 MED ORDER — FAMOTIDINE IN NACL 20-0.9 MG/50ML-% IV SOLN
20.0000 mg | Freq: Once | INTRAVENOUS | Status: AC
  Administered 2015-06-30: 20 mg via INTRAVENOUS
  Filled 2015-06-30: qty 50

## 2015-06-30 MED ORDER — OMEPRAZOLE 20 MG PO CPDR: 20.0000 mg | DELAYED_RELEASE_CAPSULE | Freq: Two times a day (BID) | ORAL | Status: DC

## 2015-06-30 MED ORDER — IOHEXOL 300 MG/ML  SOLN
25.0000 mL | Freq: Once | INTRAMUSCULAR | Status: AC | PRN
  Administered 2015-06-30: 25 mL via ORAL

## 2015-06-30 MED ORDER — SODIUM CHLORIDE 0.9 % IV BOLUS (SEPSIS)
1000.0000 mL | Freq: Once | INTRAVENOUS | Status: AC
  Administered 2015-06-30: 1000 mL via INTRAVENOUS

## 2015-06-30 MED ORDER — ONDANSETRON 4 MG PO TBDP: 4.0000 mg | ORAL_TABLET | Freq: Three times a day (TID) | ORAL | Status: DC | PRN

## 2015-06-30 MED ORDER — KETOROLAC TROMETHAMINE 30 MG/ML IJ SOLN
30.0000 mg | Freq: Once | INTRAMUSCULAR | Status: AC
  Administered 2015-06-30: 30 mg via INTRAVENOUS
  Filled 2015-06-30: qty 1

## 2015-06-30 MED ORDER — ONDANSETRON HCL 4 MG/2ML IJ SOLN
4.0000 mg | Freq: Once | INTRAMUSCULAR | Status: AC
  Administered 2015-06-30: 4 mg via INTRAVENOUS
  Filled 2015-06-30: qty 2

## 2015-06-30 NOTE — ED Notes (Signed)
PA at bedside.

## 2015-06-30 NOTE — ED Notes (Signed)
Patient transported to CT 

## 2015-06-30 NOTE — Discharge Instructions (Signed)
You have been seen today for abdominal pain and vomiting. Your imaging and lab tests showed no abnormalities. Follow up with PCP as needed. Return to ED should symptoms worsen. It should be noted the most take the omeprazole consistently every day with the first dose 30 minutes prior to breakfast and the evening dose 30 minutes prior to your evening meal.   Emergency Department Resource Guide 1) Find a Doctor and Pay Out of Pocket Although you won't have to find out who is covered by your insurance plan, it is a good idea to ask around and get recommendations. You will then need to call the office and see if the doctor you have chosen will accept you as a new patient and what types of options they offer for patients who are self-pay. Some doctors offer discounts or will set up payment plans for their patients who do not have insurance, but you will need to ask so you aren't surprised when you get to your appointment.  2) Contact Your Local Health Department Not all health departments have doctors that can see patients for sick visits, but many do, so it is worth a call to see if yours does. If you don't know where your local health department is, you can check in your phone book. The CDC also has a tool to help you locate your state's health department, and many state websites also have listings of all of their local health departments.  3) Find a Walk-in Clinic If your illness is not likely to be very severe or complicated, you may want to try a walk in clinic. These are popping up all over the country in pharmacies, drugstores, and shopping centers. They're usually staffed by nurse practitioners or physician assistants that have been trained to treat common illnesses and complaints. They're usually fairly quick and inexpensive. However, if you have serious medical issues or chronic medical problems, these are probably not your best option.  No Primary Care Doctor: - Call Health Connect at  [phone] -  they can help you locate a primary care doctor that  accepts your insurance, provides certain services, etc. - Physician Referral Service- [phone]  Chronic Pain Problems: Organization         Address  Phone   Notes  [facility]  [phone]) [phone] Patients need to be referred by their primary care doctor.   Medication Assistance: Organization         Address  Phone   Notes  [facility] [address] [place], [place] [address] [phone]) [phone] --Must be a resident of [place] -- Must have NO insurance coverage whatsoever (no Medicaid/ Medicare, etc.) -- The pt. MUST have a primary care doctor that directs their care regularly and follows them in the community   MedAssist  [phone]   [employer]  [phone]    Agencies that provide inexpensive medical care: Organization         Address  Phone   Notes  [facility]  [phone]   [facility]    [phone]   [facility] [address] [place], [place] [address] [phone]) [phone]   [facility] [address], [place] [phone]   [facility]anned [facility]    [phone]   [facility]    [phone](336) [phone]   [facility]  [address], [place] Phone:  [phone], Fax:  [phone] Hours of Operation:  9 am - 6 pm, M-F.  Also accepts Medicaid/Medicare and self-pay.  [facility] for [address], [address], [place] Phone: [phone], Fax: [phone]. Hours of Operation:  8:30 am - 5:30 pm, M-F.  Also accepts Medicaid and self-pay.  [facility] [address] [place] Phone: [phone]   [place], [place], [place] [phone], Ext. [phone] Mondays & Thursdays: 7-9 AM.  First 15 patients are seen on a first come, first serve basis.    [place] Providers:  Organization         Address  Phone   Notes  [facility] [address], [place] [phone] Also accepts self-pay patients.  [facility] [place] [address], [place]  [phone]   [place] [place], [address], [place] [phone]   [facility] [address] [place] [phone]   Lucianne Lei [address], [place]   [phone] Only accepts [place] Access [place] patients after they have their name applied to their card.   Self-Pay (no insurance) in [facility]:  Organization         Address  Phone   Notes  Sickle Cell Patients, [facility] [place] [phone]   [facility] [place] [phone]   [facility] [place]  [place], [place], [place] [phone]   [facility]/Dr. Osei-Bonsu  [address], [place] or [address], [place] [phone] Phone number for both [place] and [place] locations is the same.  [facility] [address], [place] [phone]   [facility] [address], [place] or [address] [phone]   [facility] [address] [place] [phone], phone; [phone], fax Sees patients 1st and 3rd Saturday of every month.  Must not qualify for public or private insurance (i.e. Medicaid, Medicare, [place] Health Choice, Veterans' Benefits)  Household income should be no more than 200% of the poverty level The clinic cannot treat you if you are pregnant or think you are pregnant  Sexually transmitted diseases are not treated at the clinic.    Dental Care: Organization         Address  Phone  Notes  [facility] [place] [phone] Accepts children up to age 7 who are enrolled in [place] or [place]; pregnant women with a Medicaid card; and children who have applied for Medicaid or [place] Health Choice, but were declined, whose parents can pay a reduced fee at time of service.  [facility] Department of [facility]  [address], [place] [phone] Accepts children up to age 22 who are enrolled in [place] or [place]; pregnant women with a Medicaid card; and children who have applied for Medicaid or [place] Health Choice, but were declined, whose parents can pay a reduced fee at time of service.  [place] Adult [facility] Access PROGRAM  [place] [phone] Patients are seen by appointment only. Walk-ins  are not accepted. [place] will see patients 57 years of age and older. Monday - Tuesday (8am-5pm) Most Wednesdays (8:30-5pm) $30 per visit, cash only  [facility]  [address], [facility] [phone] Patients are seen by appointment only. Walk-ins are not accepted. [place] will see patients 68 years of age and older. One Wednesday Evening (Monthly: Volunteer Based).  $30 per visit, cash only  [place]  [phone] for adults; Children under age 80, call Graduate Pediatric Dentistry at [phone]. Children aged 24-14, please call [phone] to request a pediatric application.  Dental services are provided in all areas of dental care including fillings, crowns and bridges, complete and partial dentures, implants, gum treatment, root canals, and extractions. Preventive care is also provided. Treatment is provided to both adults and children. Patients are selected via a lottery and there is often a waiting list.   [facility] [address] [place]  [phone] [email or web address]   [facility] [address] [place], [place] [phone], Ext. [phone] Second and Fourth Thursday of each month, opens at 6:30 AM; Clinic ends at 9 AM.  Patients are seen on a first-come first-served basis, and a limited number are seen during each clinic.   [facility]  [address] Hillard Danker [place], [place] [phone]   Eligibility Requirements You must have lived in [place], [place], or [place] counties for at least the last three months.   You cannot be eligible for state or federal sponsored [employer], including [employer], [place], or [employer].   You generally cannot be eligible for healthcare insurance through your employer.    How to apply: Eligibility screenings are held every Tuesday and Wednesday afternoon from 1:00 pm until 4:00 pm. You do not need an appointment for the interview!  [facility] [address], [place], [place]   [place]  [place] Department  [place]  [phone]    Behavioral Health Resources in the Community: Intensive Outpatient Programs Organization         Address  Phone  Notes  [place]. [address], [place], [place] [phone]   [facility] Outpatient [address], [place], [place]   ADS: Alcohol & Drug Svcs [address] [place], [place]   [place] [address],  [place], [place] or [phone]   Substance Abuse Resources Organization         Address  Phone  Notes  Alcohol and Drug Services  [phone]   [place]  [phone]   [facility]   Chinita Pester  [phone]   Residential & Outpatient Substance Abuse Program  [phone]   Psychological Services Organization         Address  Phone  Notes  [facility] [place]   [place]  [phone]   [place] [address], [place] [phone] or [phone]    Mobile Crisis Teams Organization         Address  Phone  Notes  [facility], Mobile Crisis Care Unit  [phone]   [facility]  [address] [place], [place]   [facility] [address] [place] [phone]  Self-Help/Support Groups Organization         Address  Phone             Notes  [facility]ental Health [facility]oc[facility] of [place] - variety of support groups  [place] Call for more information  Narcotics Anonymous (NA), [facility] [address], [employer] [place]  2 meetings at this location   Special educational needs teacher         Address  Phone  Notes  [facility]idential [facility] [place],    [place]  [phone]   [facility]  [address], [place] [address]27, [place], [place]   [place], [place] [phone] Admissions: 8am-3pm M-F  Incentives Substance [place] [address],    [employer], [place] [phone]   [facility] [address] [place], [place], [place]   [facility] [address]  [place], [place]   [facility]ight Programs - Intensive Outpatient [address], [place], [place]   [facility] ([place] [place].,  [place], [place] [phone] or [phone]   [facility] Services (RTS) [address], [place], [place] Accepts Medicaid  [facility] [place] [address],  [place] [phone] Substance Abuse/Addiction Treatment   [facility] Organization         Address  Phone  Notes  [facility]  [phone]   Domenic Schwab, PhD [address] [place], [place]   [phone] or  [phone]   [place], [place] [phone]   [facility] [address], [place], [place] [phone] Insurance/Medicaid/sponsorship through [facility] and Families [address] [place]                                    [place], [place] [phone] [place] [address], [place] [phone]    Dr. Adele Schilder  [phone]   [facility] Dept. 1) [address], [place] 2) [place] 3)  [place] [address], [place] [phone]  [phone]   [place] [phone] or [phone] (After Hours)

## 2015-06-30 NOTE — ED Notes (Signed)
Patient states that she has had abdominal pain generalized all day and now having chest pain with N/V

## 2015-06-30 NOTE — ED Notes (Signed)
Oral contrast at bedside.  Pt instructed to drink it as soon as the nausea settles down.

## 2015-06-30 NOTE — ED Provider Notes (Signed)
CSN: [id number]     Arrival date & time 06/30/15  2022 History   First MD Initiated Contact with Patient 06/30/15 2039     Chief Complaint  Patient presents with  . Abdominal Pain     (Consider location/radiation/quality/duration/timing/severity/associated sxs/prior Treatment) HPI   Victoria Smith is a 24 y.o. female, patient with history of GERD and a C-section, presenting to the ED with generalized abdominal pain, but mostly focused in the epigastric region, beginning this morning after breakfast. Pain is "like a pressure," rated 10/10, radiating into her chest and the back of her throat. Pt states she took something from [employer] that she usually takes for her GERD, but can not remember the name. Pt states this medication did not help. Patient denies fever/chills, diarrhea, constipation, shortness of breath, urinary symptoms, vaginal discharge or bleeding, or any other pain or complaints.    History reviewed. No pertinent past medical history. Past Surgical History  Procedure Laterality Date  . Cesarean section     Family History  Problem Relation Age of Onset  . Cancer Mother   . Mental retardation Brother    Social History  Substance Use Topics  . Smoking status: Current Every Day Smoker  . Smokeless tobacco: None  . Alcohol Use: No   OB History    Gravida Para Term Preterm AB TAB SAB Ectopic Multiple Living   1              Review of Systems  Constitutional: Negative for fever, chills, diaphoresis and unexpected weight change.  Respiratory: Negative for cough, chest tightness and shortness of breath.   Cardiovascular: Negative for palpitations and leg swelling.  Gastrointestinal: Positive for nausea, vomiting and abdominal pain. Negative for diarrhea and constipation.  Genitourinary: Negative for dysuria and flank pain.  Musculoskeletal: Negative for back pain.  Skin: Negative for color change and pallor.  Neurological: Negative for dizziness, syncope, weakness and  light-headedness.  All other systems reviewed and are negative.     Allergies  Review of patient's allergies indicates no known allergies.  Home Medications   Prior to Admission medications   Medication Sig Start Date End Date Taking? Authorizing Provider  famotidine (PEPCID) 20 MG tablet Take 2 tablets (40 mg total) by mouth daily. 06/30/15   Antwione Picotte C Stephenson Cichy, PA-C  omeprazole (PRILOSEC) 20 MG capsule Take 1 capsule (20 mg total) by mouth 2 (two) times daily before a meal. 06/30/15   Cleavon Goldman C Sayid Moll, PA-C  ondansetron (ZOFRAN ODT) 4 MG disintegrating tablet Take 1 tablet (4 mg total) by mouth every 8 (eight) hours as needed for nausea or vomiting. 06/30/15   Mariyanna Mucha C Tabbetha Kutscher, PA-C   BP 121/71 mmHg  Pulse 69  Temp(Src) 98.1 F (36.7 C) (Oral)  Resp 16  Ht 5\' 4"  (1.626 m)  Wt 101.152 kg  BMI 38.26 kg/m2  SpO2 100%  LMP 06/16/2015  Breastfeeding? Unknown Physical Exam  Constitutional: She appears well-developed and well-nourished. No distress.  HENT:  Head: Normocephalic and atraumatic.  Eyes: Conjunctivae are normal. Pupils are equal, round, and reactive to light.  Cardiovascular: Normal rate, regular rhythm and normal heart sounds.   Pulmonary/Chest: Effort normal and breath sounds normal. No respiratory distress.  Abdominal: Soft. Normal appearance and bowel sounds are normal. There is generalized tenderness. There is no rigidity, no guarding, no CVA tenderness, no tenderness at McBurney's point and negative Murphy's sign.  Patient is noted to be moving all of her extremities, bending forward at the hips, and moving  around the bed. When providers are in the room patient is rolling around moaning, but then when no one is in the room patient is viewed through the window and noted to still. Patient indicates generalized tenderness.  Musculoskeletal: She exhibits no edema or tenderness.  Neurological: She is alert.  Skin: Skin is warm and dry. She is not diaphoretic.  Nursing note and vitals  reviewed.   ED Course  Procedures (including critical care time) Labs Review Labs Reviewed  COMPREHENSIVE METABOLIC PANEL - Abnormal; Notable for the following:    Potassium 3.4 (*)    CO2 19 (*)    Glucose, Bld 111 (*)    Total Protein 8.4 (*)    All other components within normal limits  URINALYSIS, ROUTINE W REFLEX MICROSCOPIC (NOT AT [facility]) - Abnormal; Notable for the following:    APPearance CLOUDY (*)    pH 8.5 (*)    Ketones, ur >80 (*)    Protein, ur 100 (*)    Leukocytes, UA SMALL (*)    All other components within normal limits  URINE MICROSCOPIC-ADD ON - Abnormal; Notable for the following:    Squamous Epithelial / LPF 6-30 (*)    Bacteria, UA MANY (*)    All other components within normal limits  URINE CULTURE  LIPASE, BLOOD  CBC WITH DIFFERENTIAL/PLATELET  PREGNANCY, URINE    Imaging Review Ct Abdomen Pelvis W Contrast  06/30/2015  CLINICAL DATA:  Acute onset of generalized abdominal pain, nausea and vomiting. Initial encounter. EXAM: CT ABDOMEN AND PELVIS WITH CONTRAST TECHNIQUE: Multidetector CT imaging of the abdomen and pelvis was performed using the standard protocol following bolus administration of intravenous contrast. CONTRAST:  OMNIPAQUE IOHEXOL 300 MG/ML  SOLN COMPARISON:  Pelvic ultrasound performed 05/22/2014 FINDINGS: The visualized lung bases are clear. The liver and spleen are unremarkable in appearance. The gallbladder is within normal limits. The pancreas and adrenal glands are unremarkable. The kidneys are unremarkable in appearance. There is no evidence of hydronephrosis. No renal or ureteral stones are seen. No perinephric stranding is appreciated. No free fluid is identified. The small bowel is unremarkable in appearance. The stomach is within normal limits. No acute vascular abnormalities are seen. The appendix is normal in caliber and contains air, without evidence of appendicitis. The colon is unremarkable in appearance. The bladder is  mildly distended and grossly unremarkable. The uterus is unremarkable in appearance. The ovaries are relatively symmetric. No suspicious adnexal masses are seen. No inguinal lymphadenopathy is seen. No acute osseous abnormalities are identified. IMPRESSION: No acute abnormality seen within the abdomen or pelvis. Electronically Signed   By: Roanna Raider M.D.   On: 06/30/2015 21:53   I have personally reviewed and evaluated these images and lab results as part of my medical decision-making.   EKG Interpretation None      MDM   Final diagnoses:  Generalized abdominal pain  Non-intractable vomiting with nausea, vomiting of unspecified type  Gastroesophageal reflux disease, esophagitis presence not specified    Alyshia Kernan presents with generalized abdominal pain, that originally started in the epigastric region.  Findings and plan of care discussed with Raeford Razor, MD.  Suspect that this is an exacerbation of patient's GERD, possibly combined with a viral gastroenteritis. Patient is nontoxic appearing, not tachycardic, and has no red flags. Pt had a baby 8 months ago via vaginal delivery, but is not currently lactating. CT scan shows no abnormalities. This, combined with the fact the patient improved after Pepcid and Protonix  administration, supports the theory of this being patient's GERD. UA shows some abnormalities, but patient has no urinary symptoms, thus a urine culture was ordered. Patient to be discharged. Patient given specific instructions on GERD, proper diet, and instructions for taking her PPI. Patient voiced understanding of these instructions, agreed to the plan, and is comfortable with discharge.  Anselm Pancoast, PA-C 06/30/15 2219  Raeford Razor, MD 07/11/15 [phone]

## 2015-07-01 ENCOUNTER — Encounter (HOSPITAL_BASED_OUTPATIENT_CLINIC_OR_DEPARTMENT_OTHER): Payer: Self-pay | Admitting: Emergency Medicine

## 2015-07-01 ENCOUNTER — Emergency Department (HOSPITAL_BASED_OUTPATIENT_CLINIC_OR_DEPARTMENT_OTHER): Payer: Medicaid Other

## 2015-07-01 ENCOUNTER — Emergency Department (HOSPITAL_BASED_OUTPATIENT_CLINIC_OR_DEPARTMENT_OTHER)
Admission: EM | Admit: 2015-07-01 | Discharge: 2015-07-01 | Disposition: A | Discharge status: Home / Self Care | Payer: Medicaid Other | Attending: Emergency Medicine | Admitting: Emergency Medicine

## 2015-07-01 DIAGNOSIS — K2100 Gastro-esophageal reflux disease with esophagitis, without bleeding: Secondary | ICD-10-CM

## 2015-07-01 DIAGNOSIS — F17200 Nicotine dependence, unspecified, uncomplicated: Secondary | ICD-10-CM | POA: Insufficient documentation

## 2015-07-01 DIAGNOSIS — Z79899 Other long term (current) drug therapy: Secondary | ICD-10-CM | POA: Insufficient documentation

## 2015-07-01 DIAGNOSIS — R1013 Epigastric pain: Secondary | ICD-10-CM | POA: Diagnosis present

## 2015-07-01 HISTORY — DX: Gastro-esophageal reflux disease without esophagitis: K21.9

## 2015-07-01 MED ORDER — GI COCKTAIL ~~LOC~~
30.0000 mL | Freq: Once | ORAL | Status: AC
  Administered 2015-07-01: 30 mL via ORAL
  Filled 2015-07-01: qty 30

## 2015-07-01 MED ORDER — HYDROCODONE-ACETAMINOPHEN 5-325 MG PO TABS: 2.0000 | ORAL_TABLET | ORAL | Status: DC | PRN

## 2015-07-01 MED ORDER — ONDANSETRON 4 MG PO TBDP
4.0000 mg | ORAL_TABLET | Freq: Once | ORAL | Status: AC
  Administered 2015-07-01: 4 mg via ORAL
  Filled 2015-07-01: qty 1

## 2015-07-01 MED ORDER — MORPHINE SULFATE (PF) 10 MG/ML IV SOLN
10.0000 mg | Freq: Once | INTRAVENOUS | Status: AC
  Administered 2015-07-01: 10 mg via INTRAMUSCULAR
  Filled 2015-07-01: qty 1

## 2015-07-01 NOTE — ED Notes (Signed)
MD at bedside. 

## 2015-07-01 NOTE — ED Notes (Signed)
Reports vomiting x3 today and not having anything to eat since yesterday.

## 2015-07-01 NOTE — ED Provider Notes (Signed)
CSN: [id number]     Arrival date & time 07/01/15  1303 History   First MD Initiated Contact with Patient 07/01/15 1307     Chief Complaint  Patient presents with  . Gastroesophageal Reflux  . Abdominal Pain     HPI  Patient resides for evaluation of recurrence of chest and abdominal pain. She was seen for the exact same complaint less than 24 hours ago. She describes pain this morning upon awakening and before eating. States that she felt some relief when she left last night. She went from here to home and went to sleep. States she intended to fill her prescriptions today but she awakened having pain again and presents here is rather dramatic complaining of chest and abdominal pain.  Describes a history of reflux. She takes Pepcid on average about 1-2 times per week. Has a history of reflux in the past.  His anterior chest and epigastrium appetite and pressure with associated nausea and pain "all over" she indicates most her abdomen is painful. She describes a sour "nasty" in her mouth  Past Medical History  Diagnosis Date  . GERD (gastroesophageal reflux disease)    Past Surgical History  Procedure Laterality Date  . Cesarean section     Family History  Problem Relation Age of Onset  . Cancer Mother   . Mental retardation Brother    Social History  Substance Use Topics  . Smoking status: Current Every Day Smoker  . Smokeless tobacco: None  . Alcohol Use: No   OB History    Gravida Para Term Preterm AB TAB SAB Ectopic Multiple Living   1              Review of Systems  Constitutional: Negative for fever, chills, diaphoresis, appetite change and fatigue.  HENT: Negative for mouth sores, sore throat and trouble swallowing.   Eyes: Negative for visual disturbance.  Respiratory: Negative for cough, chest tightness, shortness of breath and wheezing.   Cardiovascular: Positive for chest pain.  Gastrointestinal: Positive for abdominal pain. Negative for nausea, vomiting,  diarrhea and abdominal distention.  Endocrine: Negative for polydipsia, polyphagia and polyuria.  Genitourinary: Negative for dysuria, frequency and hematuria.  Musculoskeletal: Negative for gait problem.  Skin: Negative for color change, pallor and rash.  Neurological: Negative for dizziness, syncope, light-headedness and headaches.  Hematological: Does not bruise/bleed easily.  Psychiatric/Behavioral: Negative for behavioral problems and confusion.      Allergies  Review of patient's allergies indicates no known allergies.  Home Medications   Prior to Admission medications   Medication Sig Start Date End Date Taking? Authorizing Provider  famotidine (PEPCID) 20 MG tablet Take 2 tablets (40 mg total) by mouth daily. 06/30/15   Shawn C Joy, PA-C  HYDROcodone-acetaminophen (NORCO/VICODIN) 5-325 MG tablet Take 2 tablets by mouth every 4 (four) hours as needed. 07/01/15   Rolland Porter, MD  omeprazole (PRILOSEC) 20 MG capsule Take 1 capsule (20 mg total) by mouth 2 (two) times daily before a meal. 06/30/15   Shawn C Joy, PA-C  ondansetron (ZOFRAN ODT) 4 MG disintegrating tablet Take 1 tablet (4 mg total) by mouth every 8 (eight) hours as needed for nausea or vomiting. 06/30/15   Shawn C Joy, PA-C   BP 135/75 mmHg  Pulse 67  Resp 17  Ht  (1.626 m)  Wt 223 lb (101.152 kg)  BMI 38.26 kg/m2  SpO2 100%  LMP 06/27/2015 Physical Exam  Constitutional: She is oriented to person, place, and time. She  appears well-developed and well-nourished. No distress.  HENT:  Head: Normocephalic.  Eyes: Conjunctivae are normal. Pupils are equal, round, and reactive to light. No scleral icterus.  Neck: Normal range of motion. Neck supple. No thyromegaly present.  Cardiovascular: Normal rate and regular rhythm.  Exam reveals no gallop and no friction rub.   No murmur heard. Pulmonary/Chest: Effort normal and breath sounds normal. No respiratory distress. She has no wheezes. She has no rales.   Abdominal: Soft. Bowel sounds are normal. She exhibits no distension. There is no tenderness. There is no rebound.  Musculoskeletal: Normal range of motion.  Neurological: She is alert and oriented to person, place, and time.  Skin: Skin is warm and dry. No rash noted.  Psychiatric: She has a normal mood and affect. Her behavior is normal.    ED Course  Procedures (including critical care time) Labs Review Labs Reviewed - No data to display  Imaging Review Dg Chest 2 View  07/01/2015  CLINICAL DATA:  Chest and abdominal pain with vomiting today. EXAM: CHEST  2 VIEW COMPARISON:  None. FINDINGS: The heart size and mediastinal contours are normal. The lungs are clear. There is no pleural effusion or pneumothorax. No acute osseous findings are identified. IMPRESSION: No active cardiopulmonary process. Electronically Signed   By: Carey BullocksWilliam  Veazey M.D.   On: 07/01/2015 14:13   Ct Abdomen Pelvis W Contrast  06/30/2015  CLINICAL DATA:  Acute onset of generalized abdominal pain, nausea and vomiting. Initial encounter. EXAM: CT ABDOMEN AND PELVIS WITH CONTRAST TECHNIQUE: Multidetector CT imaging of the abdomen and pelvis was performed using the standard protocol following bolus administration of intravenous contrast. CONTRAST:  100mL OMNIPAQUE IOHEXOL 300 MG/ML  SOLN COMPARISON:  Pelvic ultrasound performed 05/22/2014 FINDINGS: The visualized lung bases are clear. The liver and spleen are unremarkable in appearance. The gallbladder is within normal limits. The pancreas and adrenal glands are unremarkable. The kidneys are unremarkable in appearance. There is no evidence of hydronephrosis. No renal or ureteral stones are seen. No perinephric stranding is appreciated. No free fluid is identified. The small bowel is unremarkable in appearance. The stomach is within normal limits. No acute vascular abnormalities are seen. The appendix is normal in caliber and contains air, without evidence of appendicitis. The  colon is unremarkable in appearance. The bladder is mildly distended and grossly unremarkable. The uterus is unremarkable in appearance. The ovaries are relatively symmetric. No suspicious adnexal masses are seen. No inguinal lymphadenopathy is seen. No acute osseous abnormalities are identified. IMPRESSION: No acute abnormality seen within the abdomen or pelvis. Electronically Signed   By: Roanna RaiderJeffery  Chang M.D.   On: 06/30/2015 21:53   I have personally reviewed and evaluated these images and lab results as part of my medical decision-making.   EKG Interpretation None      MDM   Final diagnoses:  Gastroesophageal reflux disease with esophagitis    X-ray shows no acute abnormalities. EKG without ischemic changes or ectopy. Has been somewhat hyperventilating intermittently. This has improved. I discussed with her that the esophagitis would take some time to heal and may require short-term in medications. Discharge to continue with her prescriptions for Prilosec and Zofran. Limited amount of Vicodin for a period reflux precautions. GI referral.    Rolland PorterMark Barbara Keng, MD 07/01/15 1438

## 2015-07-01 NOTE — ED Notes (Signed)
Patient transported to X-ray via stretcher per tech. 

## 2015-07-01 NOTE — Discharge Instructions (Signed)
Gastroesophageal Reflux Disease, Adult Normally, food travels down the esophagus and stays in the stomach to be digested. If a person has gastroesophageal reflux disease (GERD), food and stomach acid move back up into the esophagus. When this happens, the esophagus becomes sore and swollen (inflamed). Over time, GERD can make small holes (ulcers) in the lining of the esophagus. HOME CARE Diet  Follow a diet as told by your doctor. You may need to avoid foods and drinks such as:  Coffee and tea (with or without caffeine).  Drinks that contain alcohol.  Energy drinks and sports drinks.  Carbonated drinks or sodas.  Chocolate and cocoa.  Peppermint and mint flavorings.  Garlic and onions.  Horseradish.  Spicy and acidic foods, such as peppers, chili powder, curry powder, vinegar, hot sauces, and BBQ sauce.  Citrus fruit juices and citrus fruits, such as oranges, lemons, and limes.  Tomato-based foods, such as red sauce, chili, salsa, and pizza with red sauce.  Fried and fatty foods, such as donuts, french fries, potato chips, and high-fat dressings.  High-fat meats, such as hot dogs, rib eye steak, sausage, ham, and bacon.  High-fat dairy items, such as whole milk, butter, and cream cheese.  Eat small meals often. Avoid eating large meals.  Avoid drinking large amounts of liquid with your meals.  Avoid eating meals during the 2-3 hours before bedtime.  Avoid lying down right after you eat.  Do not exercise right after you eat. General Instructions  Pay attention to any changes in your symptoms.  Take over-the-counter and prescription medicines only as told by your doctor. Do not take aspirin, ibuprofen, or other NSAIDs unless your doctor says it is okay.  Do not use any tobacco products, including cigarettes, chewing tobacco, and e-cigarettes. If you need help quitting, ask your doctor.  Wear loose clothes. Do not wear anything tight around your waist.  Raise  (elevate) the head of your bed about 6 inches (15 cm).  Try to lower your stress. If you need help doing this, ask your doctor.  If you are overweight, lose an amount of weight that is healthy for you. Ask your doctor about a safe weight loss goal.  Keep all follow-up visits as told by your doctor. This is important. GET HELP IF:  You have new symptoms.  You lose weight and you do not know why it is happening.  You have trouble swallowing, or it hurts to swallow.  You have wheezing or a cough that keeps happening.  Your symptoms do not get better with treatment.  You have a hoarse voice. GET HELP RIGHT AWAY IF:  You have pain in your arms, neck, jaw, teeth, or back.  You feel sweaty, dizzy, or light-headed.  You have chest pain or shortness of breath.  You throw up (vomit) and your throw up looks like blood or coffee grounds.  You pass out (faint).  Your poop (stool) is bloody or black.  You cannot swallow, drink, or eat.   This information is not intended to replace advice given to you by your health care provider. Make sure you discuss any questions you have with your health care provider.   Document Released: 01/10/2008 Document Revised: 04/14/2015 Document Reviewed: 11/18/2014 Elsevier Interactive Patient Education 2016 [employer]  -   

## 2015-07-01 NOTE — ED Notes (Signed)
24 yo with c/o chest and abdominal pain. States she was here yesterday given RX but did not fill when she was d/c. Complains of the same symptoms today as yesterday.

## 2015-07-02 LAB — URINE CULTURE

## 2015-08-20 ENCOUNTER — Encounter (HOSPITAL_BASED_OUTPATIENT_CLINIC_OR_DEPARTMENT_OTHER): Payer: Self-pay | Admitting: *Deleted

## 2015-08-20 ENCOUNTER — Emergency Department (HOSPITAL_BASED_OUTPATIENT_CLINIC_OR_DEPARTMENT_OTHER)
Admission: EM | Admit: 2015-08-20 | Discharge: 2015-08-20 | Disposition: A | Discharge status: Home / Self Care | Payer: Medicaid Other | Attending: Emergency Medicine | Admitting: Emergency Medicine

## 2015-08-20 DIAGNOSIS — K219 Gastro-esophageal reflux disease without esophagitis: Secondary | ICD-10-CM | POA: Diagnosis not present

## 2015-08-20 DIAGNOSIS — Z79899 Other long term (current) drug therapy: Secondary | ICD-10-CM | POA: Diagnosis not present

## 2015-08-20 DIAGNOSIS — Z09 Encounter for follow-up examination after completed treatment for conditions other than malignant neoplasm: Secondary | ICD-10-CM | POA: Diagnosis present

## 2015-08-20 DIAGNOSIS — Z9889 Other specified postprocedural states: Secondary | ICD-10-CM | POA: Diagnosis not present

## 2015-08-20 DIAGNOSIS — F17200 Nicotine dependence, unspecified, uncomplicated: Secondary | ICD-10-CM | POA: Insufficient documentation

## 2015-08-20 DIAGNOSIS — N3000 Acute cystitis without hematuria: Secondary | ICD-10-CM | POA: Insufficient documentation

## 2015-08-20 LAB — URINALYSIS, ROUTINE W REFLEX MICROSCOPIC
BILIRUBIN URINE: NEGATIVE
GLUCOSE, UA: NEGATIVE mg/dL
KETONES UR: NEGATIVE mg/dL
Nitrite: NEGATIVE
PROTEIN: NEGATIVE mg/dL
Specific Gravity, Urine: 1.03 (ref 1.005–1.030)
pH: 7 (ref 5.0–8.0)

## 2015-08-20 LAB — URINE MICROSCOPIC-ADD ON

## 2015-08-20 MED ORDER — PHENAZOPYRIDINE HCL 200 MG PO TABS: 200.0000 mg | ORAL_TABLET | Freq: Three times a day (TID) | ORAL | Status: DC

## 2015-08-20 MED ORDER — CEPHALEXIN 500 MG PO CAPS: 500.0000 mg | ORAL_CAPSULE | Freq: Two times a day (BID) | ORAL | Status: DC

## 2015-08-20 MED FILL — CEPHALEXIN 500 MG CAPSULE: 500 | 10 days supply | Qty: 20 | Fill #0

## 2015-08-20 NOTE — ED Provider Notes (Signed)
CSN: [id number]     Arrival date & time 08/20/15  1541 History   First MD Initiated Contact with Patient 08/20/15 1558     Chief Complaint  Patient presents with  . Follow-up     (Consider location/radiation/quality/duration/timing/severity/associated sxs/prior Treatment) HPI Comments: Patient presents with complaint of dysuria, increased frequency, increased urgency, and suprapubic pain returning over the past several days. Patient has similar symptoms 3 weeks ago when she was seen at [facility]. She had a urine culture at that time which grew out Proteus. This was susceptible to Bactrim which is what she was prescribed. She denies fever, nausea, vomiting, diarrhea. No vaginal symptoms. The onset of this condition was acute. The course is constant. Aggravating factors: none. Alleviating factors: none.    The history is provided by the patient and medical records.    Past Medical History  Diagnosis Date  . GERD (gastroesophageal reflux disease)    Past Surgical History  Procedure Laterality Date  . Cesarean section     Family History  Problem Relation Age of Onset  . Cancer Mother   . Mental retardation Brother    Social History  Substance Use Topics  . Smoking status: Current Every Day Smoker  . Smokeless tobacco: None  . Alcohol Use: No   OB History    Gravida Para Term Preterm AB TAB SAB Ectopic Multiple Living   1              Review of Systems  Constitutional: Negative for fever.  HENT: Negative for rhinorrhea and sore throat.   Eyes: Negative for redness.  Respiratory: Negative for cough.   Cardiovascular: Negative for chest pain.  Gastrointestinal: Positive for abdominal pain. Negative for nausea, vomiting and diarrhea.  Genitourinary: Positive for dysuria, urgency and frequency. Negative for hematuria, flank pain, decreased urine volume, vaginal bleeding and vaginal discharge.  Musculoskeletal: Negative for myalgias.  Skin: Negative for rash.   Neurological: Negative for headaches.      Allergies  Review of patient's allergies indicates no known allergies.  Home Medications   Prior to Admission medications   Medication Sig Start Date End Date Taking? Authorizing Provider  famotidine (PEPCID) 20 MG tablet Take 2 tablets (40 mg total) by mouth daily. 06/30/15   Shawn C Joy, PA-C  HYDROcodone-acetaminophen (NORCO/VICODIN) 5-325 MG tablet Take 2 tablets by mouth every 4 (four) hours as needed. 07/01/15   Rolland Porter, MD  omeprazole (PRILOSEC) 20 MG capsule Take 1 capsule (20 mg total) by mouth 2 (two) times daily before a meal. 06/30/15   Shawn C Joy, PA-C  ondansetron (ZOFRAN ODT) 4 MG disintegrating tablet Take 1 tablet (4 mg total) by mouth every 8 (eight) hours as needed for nausea or vomiting. 06/30/15   Shawn C Joy, PA-C   BP 128/93 mmHg  Pulse 80  Temp(Src) 98.4 F (36.9 C) (Oral)  Resp 20  Ht 5\' 4"  (1.626 m)  Wt 101.152 kg  BMI 38.26 kg/m2  SpO2 99%  LMP 08/11/2015 Physical Exam  Constitutional: She appears well-developed and well-nourished.  HENT:  Head: Normocephalic and atraumatic.  Eyes: Conjunctivae are normal. Right eye exhibits no discharge. Left eye exhibits no discharge.  Neck: Normal range of motion. Neck supple.  Cardiovascular: Normal rate, regular rhythm and normal heart sounds.   Pulmonary/Chest: Effort normal and breath sounds normal.  Abdominal: Soft. There is tenderness (Mild, suprapubic).  Neurological: She is alert.  Skin: Skin is warm and dry.  Psychiatric: She has a  normal mood and affect.  Nursing note and vitals reviewed.   ED Course  Procedures (including critical care time) Labs Review Labs Reviewed  URINALYSIS, ROUTINE W REFLEX MICROSCOPIC (NOT AT [facility]) - Abnormal; Notable for the following:    APPearance CLOUDY (*)    Hgb urine dipstick TRACE (*)    Leukocytes, UA MODERATE (*)    All other components within normal limits  URINE MICROSCOPIC-ADD ON - Abnormal; Notable for the  following:    Squamous Epithelial / LPF 6-30 (*)    Bacteria, UA MANY (*)    All other components within normal limits  URINE CULTURE    Imaging Review No results found. I have personally reviewed and evaluated these images and lab results as part of my medical decision-making.   EKG Interpretation None      5:08 PM Patient seen and examined.  Vital signs reviewed and are as follows: BP 128/93 mmHg  Pulse 80  Temp(Src) 98.4 F (36.9 C) (Oral)  Resp 20  Ht 5\' 4"  (1.626 m)  Wt 101.152 kg  BMI 38.26 kg/m2  SpO2 99%  LMP 08/11/2015   [place] records reviewed. Will treat with Keflex 10 days given recurrent UTI. Do not suspect Pyelo. Patient encouraged to return with fever, worsening symptoms, other concerns. She verbalizes understanding and agrees with plan.   MDM   Final diagnoses:  Acute cystitis without hematuria   Patient with acute cystitis. Treatment as above. No vaginal complaints. No signs of pyelonephritis.    Renne CriglerJoshua Johnanna Bakke, PA-C 08/20/15 1715  Leta BaptistEmily Roe Nguyen, MD 08/22/15 [phone]

## 2015-08-20 NOTE — Discharge Instructions (Signed)
Please read and follow all provided instructions.  Your diagnoses today include:  1. Acute cystitis without hematuria     Tests performed today include:  Urine test - suggests that you have an infection in your bladder  Vital signs. See below for your results today.   Medications prescribed:   Keflex (cephalexin) - antibiotic  You have been prescribed an antibiotic medicine: take the entire course of medicine even if you are feeling better. Stopping early can cause the antibiotic not to work.  Home care instructions:  Follow any educational materials contained in this packet.  Follow-up instructions: Please follow-up with your primary care provider in 3 days if symptoms are not resolved for further evaluation of your symptoms.  Return instructions:   Please return to the Emergency Department if you experience worsening symptoms.   Return with fever, worsening pain, persistent vomiting, worsening pain in your back.   Please return if you have any other emergent concerns.  Additional Information:  Your vital signs today were: BP 128/93 mmHg   Pulse 80   Temp(Src) 98.4 F (36.9 C) (Oral)   Resp 20   Ht 5\' 4"  (1.626 m)   Wt 101.152 kg   BMI 38.26 kg/m2   SpO2 99%   LMP 08/11/2015 If your blood pressure (BP) was elevated above 135/85 this visit, please have this repeated by your doctor within one month. --------------

## 2015-08-20 NOTE — ED Notes (Signed)
She was treated for a UTI 2 weeks ago. Lower back pain came back after the antibiotic was complete.

## 2015-08-24 LAB — URINE CULTURE

## 2015-08-25 ENCOUNTER — Telehealth (HOSPITAL_COMMUNITY): Payer: Self-pay

## 2015-08-25 NOTE — Telephone Encounter (Signed)
Post ED Visit - Positive Culture Follow-up  Culture report reviewed by antimicrobial stewardship pharmacist:   Enzo Bi, Pharm.D.  Celedonio Miyamoto, Pharm.D., BCPS  Garvin Fila, Pharm.D.  Georgina Pillion, Pharm.D., BCPS  [place], [place].D., BCPS, AAHIVP  Estella Husk, Pharm.D., BCPS, AAHIVP  Tennis Must, Pharm.D.  Rob Oswaldo Done, [address].DJacinto Reap, Pharm.D.  Positive urine culture, >/= 100,000 colonies -> Proteus Mirabilis Treated with Cephalexin, organism sensitive to the same and no further patient follow-up is required at this time.   Arvid Right 08/25/2015, 11:23 AM

## 2015-10-11 ENCOUNTER — Emergency Department (HOSPITAL_BASED_OUTPATIENT_CLINIC_OR_DEPARTMENT_OTHER)
Admission: EM | Admit: 2015-10-11 | Discharge: 2015-10-11 | Disposition: A | Discharge status: Home / Self Care | Payer: Medicaid Other | Attending: Emergency Medicine | Admitting: Emergency Medicine

## 2015-10-11 ENCOUNTER — Emergency Department (HOSPITAL_BASED_OUTPATIENT_CLINIC_OR_DEPARTMENT_OTHER): Payer: Medicaid Other

## 2015-10-11 ENCOUNTER — Encounter (HOSPITAL_BASED_OUTPATIENT_CLINIC_OR_DEPARTMENT_OTHER): Payer: Self-pay

## 2015-10-11 DIAGNOSIS — Z79899 Other long term (current) drug therapy: Secondary | ICD-10-CM | POA: Diagnosis not present

## 2015-10-11 DIAGNOSIS — R1013 Epigastric pain: Secondary | ICD-10-CM | POA: Diagnosis present

## 2015-10-11 DIAGNOSIS — K219 Gastro-esophageal reflux disease without esophagitis: Secondary | ICD-10-CM | POA: Insufficient documentation

## 2015-10-11 DIAGNOSIS — Z792 Long term (current) use of antibiotics: Secondary | ICD-10-CM | POA: Diagnosis not present

## 2015-10-11 DIAGNOSIS — Z3202 Encounter for pregnancy test, result negative: Secondary | ICD-10-CM | POA: Diagnosis not present

## 2015-10-11 DIAGNOSIS — R079 Chest pain, unspecified: Secondary | ICD-10-CM | POA: Insufficient documentation

## 2015-10-11 DIAGNOSIS — Z87891 Personal history of nicotine dependence: Secondary | ICD-10-CM | POA: Diagnosis not present

## 2015-10-11 DIAGNOSIS — R112 Nausea with vomiting, unspecified: Secondary | ICD-10-CM | POA: Insufficient documentation

## 2015-10-11 LAB — URINALYSIS, ROUTINE W REFLEX MICROSCOPIC
Bilirubin Urine: NEGATIVE
GLUCOSE, UA: NEGATIVE mg/dL
HGB URINE DIPSTICK: NEGATIVE
Ketones, ur: 40 mg/dL — AB
Leukocytes, UA: NEGATIVE
Nitrite: NEGATIVE
PROTEIN: 100 mg/dL — AB
SPECIFIC GRAVITY, URINE: 1.036 — AB (ref 1.005–1.030)
pH: 8.5 — ABNORMAL HIGH (ref 5.0–8.0)

## 2015-10-11 LAB — COMPREHENSIVE METABOLIC PANEL
ALBUMIN: 4 g/dL (ref 3.5–5.0)
ALK PHOS: 61 U/L (ref 38–126)
ALT: 21 U/L (ref 14–54)
AST: 20 U/L (ref 15–41)
Anion gap: 11 (ref 5–15)
BUN: 9 mg/dL (ref 6–20)
CALCIUM: 9.2 mg/dL (ref 8.9–10.3)
CO2: 20 mmol/L — AB (ref 22–32)
CREATININE: 0.78 mg/dL (ref 0.44–1.00)
Chloride: 106 mmol/L (ref 101–111)
GFR calc Af Amer: >60 mL/min (ref >60)
GFR calc non Af Amer: >60 mL/min (ref >60)
GLUCOSE: 126 mg/dL — AB (ref 65–99)
Potassium: 3.4 mmol/L — ABNORMAL LOW (ref 3.5–5.1)
SODIUM: 137 mmol/L (ref 135–145)
Total Bilirubin: 0.6 mg/dL (ref 0.3–1.2)
Total Protein: 7.8 g/dL (ref 6.5–8.1)

## 2015-10-11 LAB — CBC WITH DIFFERENTIAL/PLATELET
BASOS PCT: 0 %
Basophils Absolute: 0 10*3/uL (ref 0.0–0.1)
EOS ABS: 0 10*3/uL (ref 0.0–0.7)
Eosinophils Relative: 0 %
HCT: 35.9 % — ABNORMAL LOW (ref 36.0–46.0)
HEMOGLOBIN: 12.3 g/dL (ref 12.0–15.0)
Lymphocytes Relative: 8 %
Lymphs Abs: 0.6 10*3/uL — ABNORMAL LOW (ref 0.7–4.0)
MCH: 28.5 pg (ref 26.0–34.0)
MCHC: 34.3 g/dL (ref 30.0–36.0)
MCV: 83.3 fL (ref 78.0–100.0)
Monocytes Absolute: 0.2 10*3/uL (ref 0.1–1.0)
Monocytes Relative: 3 %
NEUTROS PCT: 89 %
Neutro Abs: 6.7 10*3/uL (ref 1.7–7.7)
Platelets: 313 10*3/uL (ref 150–400)
RBC: 4.31 MIL/uL (ref 3.87–5.11)
RDW: 13.1 % (ref 11.5–15.5)
WBC: 7.5 10*3/uL (ref 4.0–10.5)

## 2015-10-11 LAB — URINE MICROSCOPIC-ADD ON: RBC / HPF: NONE SEEN RBC/hpf (ref 0–5)

## 2015-10-11 LAB — PREGNANCY, URINE: Preg Test, Ur: NEGATIVE

## 2015-10-11 LAB — LIPASE, BLOOD: Lipase: 25 U/L (ref 11–51)

## 2015-10-11 MED ORDER — PANTOPRAZOLE SODIUM 40 MG IV SOLR
40.0000 mg | Freq: Once | INTRAVENOUS | Status: AC
  Administered 2015-10-11: 40 mg via INTRAVENOUS
  Filled 2015-10-11: qty 40

## 2015-10-11 MED ORDER — ONDANSETRON HCL 4 MG PO TABS: 4.0000 mg | ORAL_TABLET | Freq: Four times a day (QID) | ORAL | Status: DC

## 2015-10-11 MED ORDER — ONDANSETRON HCL 4 MG/2ML IJ SOLN
4.0000 mg | Freq: Once | INTRAMUSCULAR | Status: AC
  Administered 2015-10-11: 4 mg via INTRAVENOUS
  Filled 2015-10-11: qty 2

## 2015-10-11 NOTE — ED Notes (Signed)
Patient transported to X-ray 

## 2015-10-11 NOTE — ED Provider Notes (Signed)
CSN: [id number]     Arrival date & time 10/11/15  1725 History   First MD Initiated Contact with Patient 10/11/15 1946     Chief Complaint  Patient presents with  . Abdominal Pain     (Consider location/radiation/quality/duration/timing/severity/associated sxs/prior Treatment) HPI Comments: Patient with a history of GERD presents today with a chief complaint of epigastric abdominal pain.  Pain has been present since this morning.  She states that the pain is radiating into her chest.  She describes the pain as a burning pain.  She has taken Omeprazole for the pain with no relief.  She reports 6-7 episodes of associated vomiting.  Denies any blood in her emesis.  She reports mild associated SOB at this time.  Denies fever or chills.  Denies cough.  Denies diarrhea or constipation.  She denies any known history of PUD.  Patient is a 25 y.o. female presenting with abdominal pain. The history is provided by the patient.  Abdominal Pain   Past Medical History  Diagnosis Date  . GERD (gastroesophageal reflux disease)    Past Surgical History  Procedure Laterality Date  . Cesarean section     Family History  Problem Relation Age of Onset  . Cancer Mother   . Mental retardation Brother    Social History  Substance Use Topics  . Smoking status: Former Games developermoker  . Smokeless tobacco: None  . Alcohol Use: No   OB History    Gravida Para Term Preterm AB TAB SAB Ectopic Multiple Living   1              Review of Systems  Gastrointestinal: Positive for abdominal pain.  All other systems reviewed and are negative.     Allergies  Review of patient's allergies indicates no known allergies.  Home Medications   Prior to Admission medications   Medication Sig Start Date End Date Taking? Authorizing Provider  cephALEXin (KEFLEX) 500 MG capsule Take 1 capsule (500 mg total) by mouth 2 (two) times daily. 08/20/15   Renne CriglerJoshua Geiple, PA-C  famotidine (PEPCID) 20 MG tablet Take 2 tablets (40 mg  total) by mouth daily. 06/30/15   Shawn C Joy, PA-C  HYDROcodone-acetaminophen (NORCO/VICODIN) 5-325 MG tablet Take 2 tablets by mouth every 4 (four) hours as needed. 07/01/15   Rolland PorterMark James, MD  omeprazole (PRILOSEC) 20 MG capsule Take 1 capsule (20 mg total) by mouth 2 (two) times daily before a meal. 06/30/15   Shawn C Joy, PA-C  ondansetron (ZOFRAN ODT) 4 MG disintegrating tablet Take 1 tablet (4 mg total) by mouth every 8 (eight) hours as needed for nausea or vomiting. 06/30/15   Shawn C Joy, PA-C  phenazopyridine (PYRIDIUM) 200 MG tablet Take 1 tablet (200 mg total) by mouth 3 (three) times daily. 08/20/15   Renne CriglerJoshua Geiple, PA-C   BP 121/70 mmHg  Pulse 63  Temp(Src) 98 F (36.7 C) (Oral)  Resp 18  Ht 5\' 1"  (1.549 m)  Wt 98.431 kg  BMI 41.02 kg/m2  SpO2 100%  LMP 09/20/2015 Physical Exam  Constitutional: She appears well-developed and well-nourished.  HENT:  Head: Normocephalic and atraumatic.  Mouth/Throat: Oropharynx is clear and moist.  Neck: Normal range of motion. Neck supple.  Cardiovascular: Normal rate, regular rhythm and normal heart sounds.   Pulmonary/Chest: Effort normal and breath sounds normal.  Abdominal: Soft. Bowel sounds are normal. She exhibits no distension and no mass. There is tenderness in the epigastric area. There is no rebound, no guarding, no tenderness  at McBurney's point and negative Murphy's sign.  Mild diffuse tenderness to palpation  Musculoskeletal: Normal range of motion.  Neurological: She is alert.  Skin: Skin is warm and dry.  Psychiatric: She has a normal mood and affect.  Nursing note and vitals reviewed.   ED Course  Procedures (including critical care time) Labs Review Labs Reviewed  URINALYSIS, ROUTINE W REFLEX MICROSCOPIC (NOT AT [facility]) - Abnormal; Notable for the following:    APPearance CLOUDY (*)    Specific Gravity, Urine 1.036 (*)    pH 8.5 (*)    Ketones, ur 40 (*)    Protein, ur 100 (*)    All other components within normal  limits  URINE MICROSCOPIC-ADD ON - Abnormal; Notable for the following:    Squamous Epithelial / LPF 0-5 (*)    Bacteria, UA RARE (*)    All other components within normal limits  CBC WITH DIFFERENTIAL/PLATELET - Abnormal; Notable for the following:    HCT 35.9 (*)    Lymphs Abs 0.6 (*)    All other components within normal limits  COMPREHENSIVE METABOLIC PANEL - Abnormal; Notable for the following:    Potassium 3.4 (*)    CO2 20 (*)    Glucose, Bld 126 (*)    All other components within normal limits  PREGNANCY, URINE  LIPASE, BLOOD    Imaging Review Dg Chest 2 View  10/11/2015  CLINICAL DATA:  25 year old female with nausea and vomiting 12 hours ago, now complaining of chest pain and shortness of breath. EXAM: CHEST  2 VIEW COMPARISON:  Chest x-ray 07/01/2015. FINDINGS: Lung volumes are normal. No consolidative airspace disease. No pleural effusions. No pneumothorax. No pulmonary nodule or mass noted. Pulmonary vasculature and the cardiomediastinal silhouette are within normal limits. IMPRESSION: No radiographic evidence of acute cardiopulmonary disease. Electronically Signed   By: Trudie Reed M.D.   On: 10/11/2015 20:52   I have personally reviewed and evaluated these images and lab results as part of my medical decision-making.   EKG Interpretation None      MDM   Final diagnoses:  Chest pain   Patient with a history of GERD presents today with epigastric abdominal pain, nausea, and vomiting.  Labs unremarkable.   Symptoms improved in the ED.  No vomiting during ED course.  Patient tolerating PO liquids.  Feel that the patient is stable for discharge.  Return precautions given.    Santiago Glad, PA-C 10/13/15 2124  Nelva Nay, MD 10/14/15 [phone]

## 2015-10-11 NOTE — ED Notes (Addendum)
Pt crying and hyperventilating during triage.  Reports hx of acid reflux and having same with n/v.  Stopped with rapid breathing when temperature taken and calmed until RN started speaking with her again.

## 2016-01-05 ENCOUNTER — Encounter (HOSPITAL_BASED_OUTPATIENT_CLINIC_OR_DEPARTMENT_OTHER): Payer: Self-pay | Admitting: Emergency Medicine

## 2016-01-05 ENCOUNTER — Emergency Department (HOSPITAL_BASED_OUTPATIENT_CLINIC_OR_DEPARTMENT_OTHER)
Admission: EM | Admit: 2016-01-05 | Discharge: 2016-01-05 | Disposition: A | Discharge status: Home / Self Care | Payer: Medicaid Other | Attending: Emergency Medicine | Admitting: Emergency Medicine

## 2016-01-05 DIAGNOSIS — E876 Hypokalemia: Secondary | ICD-10-CM | POA: Insufficient documentation

## 2016-01-05 DIAGNOSIS — Z87891 Personal history of nicotine dependence: Secondary | ICD-10-CM | POA: Diagnosis not present

## 2016-01-05 DIAGNOSIS — R109 Unspecified abdominal pain: Secondary | ICD-10-CM | POA: Diagnosis not present

## 2016-01-05 DIAGNOSIS — R112 Nausea with vomiting, unspecified: Secondary | ICD-10-CM | POA: Diagnosis not present

## 2016-01-05 LAB — BASIC METABOLIC PANEL
Anion gap: 12 (ref 5–15)
BUN: 10 mg/dL (ref 6–20)
CO2: 23 mmol/L (ref 22–32)
Calcium: 9.6 mg/dL (ref 8.9–10.3)
Chloride: 101 mmol/L (ref 101–111)
Creatinine, Ser: 0.72 mg/dL (ref 0.44–1.00)
GFR calc Af Amer: >60 mL/min (ref >60)
GFR calc non Af Amer: >60 mL/min (ref >60)
Glucose, Bld: 117 mg/dL — ABNORMAL HIGH (ref 65–99)
Potassium: 2.7 mmol/L — CL (ref 3.5–5.1)
Sodium: 136 mmol/L (ref 135–145)

## 2016-01-05 LAB — CBC
HCT: 38.6 % (ref 36.0–46.0)
Hemoglobin: 13.6 g/dL (ref 12.0–15.0)
MCH: 29.4 pg (ref 26.0–34.0)
MCHC: 35.2 g/dL (ref 30.0–36.0)
MCV: 83.5 fL (ref 78.0–100.0)
Platelets: 373 10*3/uL (ref 150–400)
RBC: 4.62 MIL/uL (ref 3.87–5.11)
RDW: 12.5 % (ref 11.5–15.5)
WBC: 9 10*3/uL (ref 4.0–10.5)

## 2016-01-05 LAB — MAGNESIUM: Magnesium: 2 mg/dL (ref 1.7–2.4)

## 2016-01-05 MED ORDER — METOCLOPRAMIDE HCL 5 MG/ML IJ SOLN
10.0000 mg | Freq: Once | INTRAMUSCULAR | Status: AC
  Administered 2016-01-05: 10 mg via INTRAVENOUS
  Filled 2016-01-05: qty 2

## 2016-01-05 MED ORDER — POTASSIUM CHLORIDE 10 MEQ/100ML IV SOLN
10.0000 meq | INTRAVENOUS | Status: AC
  Administered 2016-01-05 (×3): 10 meq via INTRAVENOUS
  Filled 2016-01-05 (×3): qty 100

## 2016-01-05 MED ORDER — FAMOTIDINE IN NACL 20-0.9 MG/50ML-% IV SOLN
20.0000 mg | Freq: Once | INTRAVENOUS | Status: AC
  Administered 2016-01-05: 20 mg via INTRAVENOUS
  Filled 2016-01-05: qty 50

## 2016-01-05 MED ORDER — ONDANSETRON HCL 4 MG PO TABS: 4.0000 mg | ORAL_TABLET | Freq: Four times a day (QID) | ORAL | Status: DC

## 2016-01-05 MED ORDER — HYDROMORPHONE HCL 1 MG/ML IJ SOLN
1.0000 mg | Freq: Once | INTRAMUSCULAR | Status: AC
  Administered 2016-01-05: 1 mg via INTRAVENOUS
  Filled 2016-01-05: qty 1

## 2016-01-05 MED ORDER — SODIUM CHLORIDE 0.9 % IV BOLUS (SEPSIS)
1000.0000 mL | Freq: Once | INTRAVENOUS | Status: AC
  Administered 2016-01-05: 1000 mL via INTRAVENOUS

## 2016-01-05 MED ORDER — POTASSIUM CHLORIDE CRYS ER 20 MEQ PO TBCR
40.0000 meq | EXTENDED_RELEASE_TABLET | Freq: Once | ORAL | Status: AC
  Administered 2016-01-05: 40 meq via ORAL
  Filled 2016-01-05: qty 2

## 2016-01-05 MED ORDER — ONDANSETRON HCL 4 MG/2ML IJ SOLN
4.0000 mg | Freq: Once | INTRAMUSCULAR | Status: AC
  Administered 2016-01-05: 4 mg via INTRAVENOUS
  Filled 2016-01-05: qty 2

## 2016-01-05 MED ORDER — SODIUM CHLORIDE 0.9 % IV SOLN
1000.0000 mL | Freq: Once | INTRAVENOUS | Status: AC
  Administered 2016-01-05: 1000 mL via INTRAVENOUS

## 2016-01-05 MED ORDER — POTASSIUM CHLORIDE 20 MEQ/15ML (10%) PO SOLN
40.0000 meq | Freq: Once | ORAL | Status: DC
Start: 2016-01-05 — End: 2016-01-05

## 2016-01-05 NOTE — Discharge Instructions (Signed)
Abdominal Pain, Adult °Many things can cause abdominal pain. Usually, abdominal pain is not caused by a disease and will improve without treatment. It can often be observed and treated at home. Your health care provider will do a physical exam and possibly order blood tests and X-rays to help determine the seriousness of your pain. However, in many cases, more time must pass before a clear cause of the pain can be found. Before that point, your health care provider may not know if you need more testing or further treatment. °HOME CARE INSTRUCTIONS °Monitor your abdominal pain for any changes. The following actions may help to alleviate any discomfort you are experiencing: °· Only take over-the-counter or prescription medicines as directed by your health care provider. °· Do not take laxatives unless directed to do so by your health care provider. °· Try a clear liquid diet (broth, tea, or water) as directed by your health care provider. Slowly move to a bland diet as tolerated. °SEEK MEDICAL CARE IF: °· You have unexplained abdominal pain. °· You have abdominal pain associated with nausea or diarrhea. °· You have pain when you urinate or have a bowel movement. °· You experience abdominal pain that wakes you in the night. °· You have abdominal pain that is worsened or improved by eating food. °· You have abdominal pain that is worsened with eating fatty foods. °· You have a fever. °SEEK IMMEDIATE MEDICAL CARE IF: °· Your pain does not go away within 2 hours. °· You keep throwing up (vomiting). °· Your pain is felt only in portions of the abdomen, such as the right side or the left lower portion of the abdomen. °· You pass bloody or black tarry stools. °MAKE SURE YOU: °· Understand these instructions. °· Will watch your condition. °· Will get help right away if you are not doing well or get worse. °  °This information is not intended to replace advice given to you by your health care provider. Make sure you discuss  any questions you have with your health care provider. °  °Document Released: 05/03/2005 Document Revised: 04/14/2015 Document Reviewed: 04/02/2013 °Elsevier Interactive Patient Education ©2016 [employer] ° °Hypokalemia °Hypokalemia means that the amount of potassium in the blood is lower than normal. Potassium is a chemical, called an electrolyte, that helps regulate the amount of fluid in the body. It also stimulates muscle contraction and helps nerves function properly. Most of the body's potassium is inside of cells, and only a very small amount is in the blood. Because the amount in the blood is so small, minor changes can be life-threatening. °CAUSES °· Antibiotics. °· Diarrhea or vomiting. °· Using laxatives too much, which can cause diarrhea. °· Chronic kidney disease. °· Water pills (diuretics). °· Eating disorders (bulimia). °· Low magnesium level. °· Sweating a lot. °SIGNS AND SYMPTOMS °· Weakness. °· Constipation. °· Fatigue. °· Muscle cramps. °· Mental confusion. °· Skipped heartbeats or irregular heartbeat (palpitations). °· Tingling or numbness. °DIAGNOSIS  °Your health care provider can diagnose hypokalemia with blood tests. In addition to checking your potassium level, your health care provider may also check other lab tests. °TREATMENT °Hypokalemia can be treated with potassium supplements taken by mouth or adjustments in your current medicines. If your potassium level is very low, you may need to get potassium through a vein (IV) and be monitored in the hospital. A diet high in potassium is also helpful. Foods high in potassium are: °· Nuts, such as peanuts and pistachios. °· Seeds, such as   sunflower seeds and pumpkin seeds. °· Peas, lentils, and lima beans. °· Whole grain and bran cereals and breads. °· Fresh fruit and vegetables, such as apricots, avocado, bananas, cantaloupe, kiwi, oranges, tomatoes, asparagus, and potatoes. °· Orange and tomato juices. °· Red meats. °· Fruit yogurt. °HOME  CARE INSTRUCTIONS °· Take all medicines as prescribed by your health care provider. °· Maintain a healthy diet by including nutritious food, such as fruits, vegetables, nuts, whole grains, and lean meats. °· If you are taking a laxative, be sure to follow the directions on the label. °SEEK MEDICAL CARE IF: °· Your weakness gets worse. °· You feel your heart pounding or racing. °· You are vomiting or having diarrhea. °· You are diabetic and having trouble keeping your blood glucose in the normal range. °SEEK IMMEDIATE MEDICAL CARE IF: °· You have chest pain, shortness of breath, or dizziness. °· You are vomiting or having diarrhea for more than 2 days. °· You faint. °MAKE SURE YOU:  °· Understand these instructions. °· Will watch your condition. °· Will get help right away if you are not doing well or get worse. °  °This information is not intended to replace advice given to you by your health care provider. Make sure you discuss any questions you have with your health care provider. °  °Document Released: 07/24/2005 Document Revised: 08/14/2014 Document Reviewed: 01/24/2013 °Elsevier Interactive Patient Education ©2016 Elsevier Inc. ° °

## 2016-01-05 NOTE — ED Provider Notes (Signed)
CSN: [id number]     Arrival date & time 01/05/16  1243 History   First MD Initiated Contact with Patient 01/05/16 1437     Chief Complaint  Patient presents with  . Abdominal Pain  . Chest Pain     (Consider location/radiation/quality/duration/timing/severity/associated sxs/prior Treatment) HPI   25yF with abdominal pain and n/v. She is s/p cholecystectomy ~2.5 weeks ago. Was recovering well post-op until seen in the ED at [facility] on 5/27 with similar symptoms as presenting today. Burning pain from upper abdomen into chest. Multiple episodes of vomiting. Is prescribed phenergan, oxycodone and protonix but hasnt been able to keep these down. No fever or chills. Having BM. Decreased urinary output but no urinary complaints otherwise.   Past Medical History  Diagnosis Date  . GERD (gastroesophageal reflux disease)    Past Surgical History  Procedure Laterality Date  . Cesarean section    . Cholecystectomy     Family History  Problem Relation Age of Onset  . Cancer Mother   . Mental retardation Brother    Social History  Substance Use Topics  . Smoking status: Former Games developer  . Smokeless tobacco: None  . Alcohol Use: No   OB History    Gravida Para Term Preterm AB TAB SAB Ectopic Multiple Living   1              Review of Systems  All systems reviewed and negative, other than as noted in HPI.   Allergies  Review of patient's allergies indicates no known allergies.  Home Medications   Prior to Admission medications   Medication Sig Start Date End Date Taking? Authorizing Provider  famotidine (PEPCID) 20 MG tablet Take 2 tablets (40 mg total) by mouth daily. 06/30/15   Shawn C Joy, PA-C  omeprazole (PRILOSEC) 20 MG capsule Take 1 capsule (20 mg total) by mouth 2 (two) times daily before a meal. 06/30/15   Shawn C Joy, PA-C  ondansetron (ZOFRAN) 4 MG tablet Take 1 tablet (4 mg total) by mouth every 6 (six) hours. 10/11/15   Heather Laisure, PA-C   BP 123/76 mmHg  Pulse 70   Resp 19  SpO2 100%  LMP 12/22/2015 Physical Exam  Constitutional: She appears well-developed and well-nourished. No distress.  HENT:  Head: Normocephalic and atraumatic.  Eyes: Conjunctivae are normal. Right eye exhibits no discharge. Left eye exhibits no discharge.  Neck: Neck supple.  Cardiovascular: Normal rate, regular rhythm and normal heart sounds.  Exam reveals no gallop and no friction rub.   No murmur heard. Pulmonary/Chest: Effort normal and breath sounds normal. No respiratory distress.  Abdominal:  Soft. No distension. Incisions appear to be healing w/o complication. Minimal epigastric tenderness.  Musculoskeletal: She exhibits no edema or tenderness.  Neurological: She is alert.  Skin: Skin is warm and dry.  Psychiatric: She has a normal mood and affect. Her behavior is normal. Thought content normal.  Nursing note and vitals reviewed.   ED Course  Procedures (including critical care time) Labs Review Labs Reviewed  BASIC METABOLIC PANEL - Abnormal; Notable for the following:    Potassium 2.7 (*)    Glucose, Bld 117 (*)    All other components within normal limits  CBC  MAGNESIUM    Imaging Review No results found. I have personally reviewed and evaluated these images and lab results as part of my medical decision-making.   EKG Interpretation   Date/Time:  Wednesday Jan 05 2016 12:53:32 EDT Ventricular Rate:  80 PR Interval:  116 QRS Duration: 86 QT Interval:  384 QTC Calculation: 442 R Axis:   57 Text Interpretation:  Normal sinus rhythm Normal ECG Confirmed by Juleen ChinaKOHUT   MD, Uzoma Vivona ([id number]) on 01/05/2016 3:09:20 PM      MDM   Final diagnoses:  Abdominal pain, unspecified abdominal location  Non-intractable vomiting with nausea, vomiting of unspecified type  Hypokalemia    25yF with abdominal pain and n/v. Suspect this may be gastritis/esophagitis. Abdominal exam is reassuring. Soft. No distension. Incisions appear to be healing w/o complication.  Minimal epigastric tenderness. I have a very low suspicion for serious post-op complication or other emergent process. K low. Supplemented. Now feeling much better. Anticipate discharge after last K run.     Raeford RazorStephen Gayla Benn, MD 01/05/16 [phone]

## 2016-01-05 NOTE — ED Notes (Signed)
States has hx of reflux, states 2 weeks ago had a cholecystectomy at [facility], today, states now has N/V, unable to keep any PO fluids down

## 2016-01-05 NOTE — ED Notes (Signed)
Pt placed on cont cardiac monitoring secondary to K level

## 2016-01-05 NOTE — ED Notes (Signed)
EDP informed of K level

## 2016-01-05 NOTE — ED Notes (Signed)
Reinformed EDP of pts K level

## 2016-01-05 NOTE — ED Notes (Signed)
2d liter of NS initiated

## 2016-01-05 NOTE — ED Notes (Signed)
Pt is two weeks post op [facility].  Pt having abdominal pain with N/V and acid reflux pain in chest since Friday.  [facility]ent to [facility] on Saturday for [facility].  Pt states they gave her medication for acid reflux but symptoms have not subsided.

## 2016-01-05 NOTE — ED Notes (Signed)
Pt given d/c instructions as per chart. Rx x 1. Verbalizes understanding. No questions. 

## 2016-01-05 NOTE — ED Notes (Signed)
Pt placed on 2L [place] O2 -- pt sleepy, sats ranging from 88-93% on RA. Sats now 98-100% on [place].

## 2016-01-07 ENCOUNTER — Encounter (HOSPITAL_BASED_OUTPATIENT_CLINIC_OR_DEPARTMENT_OTHER): Payer: Self-pay | Admitting: *Deleted

## 2016-01-07 ENCOUNTER — Emergency Department (HOSPITAL_BASED_OUTPATIENT_CLINIC_OR_DEPARTMENT_OTHER)
Admission: EM | Admit: 2016-01-07 | Discharge: 2016-01-07 | Disposition: A | Discharge status: Home / Self Care | Payer: Medicaid Other | Attending: Emergency Medicine | Admitting: Emergency Medicine

## 2016-01-07 DIAGNOSIS — R1013 Epigastric pain: Secondary | ICD-10-CM

## 2016-01-07 DIAGNOSIS — Z79899 Other long term (current) drug therapy: Secondary | ICD-10-CM | POA: Diagnosis not present

## 2016-01-07 DIAGNOSIS — Z87891 Personal history of nicotine dependence: Secondary | ICD-10-CM | POA: Insufficient documentation

## 2016-01-07 DIAGNOSIS — K2100 Gastro-esophageal reflux disease with esophagitis, without bleeding: Secondary | ICD-10-CM

## 2016-01-07 DIAGNOSIS — R112 Nausea with vomiting, unspecified: Secondary | ICD-10-CM

## 2016-01-07 LAB — URINALYSIS, ROUTINE W REFLEX MICROSCOPIC
GLUCOSE, UA: NEGATIVE mg/dL
Hgb urine dipstick: NEGATIVE
Ketones, ur: >80 mg/dL — AB
Nitrite: NEGATIVE
PROTEIN: NEGATIVE mg/dL
SPECIFIC GRAVITY, URINE: 1.02 (ref 1.005–1.030)
pH: 7.5 (ref 5.0–8.0)

## 2016-01-07 LAB — CBC
HCT: 35.5 % — ABNORMAL LOW (ref 36.0–46.0)
HEMOGLOBIN: 12.4 g/dL (ref 12.0–15.0)
MCH: 29.7 pg (ref 26.0–34.0)
MCHC: 34.9 g/dL (ref 30.0–36.0)
MCV: 84.9 fL (ref 78.0–100.0)
PLATELETS: 335 10*3/uL (ref 150–400)
RBC: 4.18 MIL/uL (ref 3.87–5.11)
RDW: 12.5 % (ref 11.5–15.5)
WBC: 6.4 10*3/uL (ref 4.0–10.5)

## 2016-01-07 LAB — PREGNANCY, URINE: Preg Test, Ur: NEGATIVE

## 2016-01-07 LAB — URINE MICROSCOPIC-ADD ON

## 2016-01-07 LAB — TROPONIN I

## 2016-01-07 LAB — COMPREHENSIVE METABOLIC PANEL
ALBUMIN: 3.9 g/dL (ref 3.5–5.0)
ALT: 35 U/L (ref 14–54)
ANION GAP: 11 (ref 5–15)
AST: 20 U/L (ref 15–41)
Alkaline Phosphatase: 69 U/L (ref 38–126)
BILIRUBIN TOTAL: 0.7 mg/dL (ref 0.3–1.2)
BUN: 5 mg/dL — AB (ref 6–20)
CHLORIDE: 103 mmol/L (ref 101–111)
CO2: 23 mmol/L (ref 22–32)
Calcium: 9.3 mg/dL (ref 8.9–10.3)
Creatinine, Ser: 0.72 mg/dL (ref 0.44–1.00)
GFR calc Af Amer: >60 mL/min (ref >60)
GFR calc non Af Amer: >60 mL/min (ref >60)
GLUCOSE: 91 mg/dL (ref 65–99)
POTASSIUM: 3 mmol/L — AB (ref 3.5–5.1)
SODIUM: 137 mmol/L (ref 135–145)
TOTAL PROTEIN: 7.7 g/dL (ref 6.5–8.1)

## 2016-01-07 LAB — LIPASE, BLOOD: LIPASE: 40 U/L (ref 11–51)

## 2016-01-07 MED ORDER — MORPHINE SULFATE (PF) 2 MG/ML IV SOLN
2.0000 mg | Freq: Once | INTRAVENOUS | Status: AC
  Administered 2016-01-07: 2 mg via INTRAVENOUS
  Filled 2016-01-07: qty 1

## 2016-01-07 MED ORDER — ONDANSETRON HCL 4 MG/2ML IJ SOLN
4.0000 mg | Freq: Once | INTRAMUSCULAR | Status: AC
  Administered 2016-01-07: 4 mg via INTRAVENOUS
  Filled 2016-01-07: qty 2

## 2016-01-07 MED ORDER — PROMETHAZINE HCL 25 MG RE SUPP
25.0000 mg | Freq: Four times a day (QID) | RECTAL | Status: DC | PRN
Start: 2016-01-07 — End: 2019-11-23

## 2016-01-07 MED ORDER — SODIUM CHLORIDE 0.9 % IV BOLUS (SEPSIS)
1000.0000 mL | Freq: Once | INTRAVENOUS | Status: AC
  Administered 2016-01-07: 1000 mL via INTRAVENOUS

## 2016-01-07 MED ORDER — METOCLOPRAMIDE HCL 5 MG/ML IJ SOLN
10.0000 mg | Freq: Once | INTRAMUSCULAR | Status: AC
  Administered 2016-01-07: 10 mg via INTRAVENOUS
  Filled 2016-01-07: qty 2

## 2016-01-07 MED ORDER — POTASSIUM CHLORIDE 10 MEQ/100ML IV SOLN
10.0000 meq | Freq: Once | INTRAVENOUS | Status: AC
  Administered 2016-01-07: 10 meq via INTRAVENOUS
  Filled 2016-01-07: qty 100

## 2016-01-07 MED ORDER — DIPHENHYDRAMINE HCL 50 MG/ML IJ SOLN
25.0000 mg | Freq: Once | INTRAMUSCULAR | Status: AC
  Administered 2016-01-07: 25 mg via INTRAVENOUS
  Filled 2016-01-07: qty 1

## 2016-01-07 MED ORDER — GI COCKTAIL ~~LOC~~
30.0000 mL | Freq: Once | ORAL | Status: AC
  Administered 2016-01-07: 30 mL via ORAL
  Filled 2016-01-07: qty 30

## 2016-01-07 MED ORDER — ONDANSETRON 4 MG PO TBDP: 4.0000 mg | ORAL_TABLET | Freq: Three times a day (TID) | ORAL | Status: DC | PRN

## 2016-01-07 MED ORDER — SUCRALFATE 1 GM/10ML PO SUSP: 1.0000 g | Freq: Three times a day (TID) | ORAL | Status: DC

## 2016-01-07 MED ORDER — ACETAMINOPHEN 325 MG PO TABS
650.0000 mg | ORAL_TABLET | Freq: Once | ORAL | Status: AC
  Administered 2016-01-07: 650 mg via ORAL
  Filled 2016-01-07: qty 2

## 2016-01-07 NOTE — ED Notes (Signed)
Pt verbalizes understanding of d/c instructions and denies any further need at this time. 

## 2016-01-07 NOTE — ED Notes (Signed)
Per pt report was seen here on 5/31 was better for one day then symptoms return. Taking medication as prescribed. Able to drink water , started vomiting earlier today.

## 2016-01-07 NOTE — ED Provider Notes (Signed)
CSN: [id number]     Arrival date & time 01/07/16  1557 History   First MD Initiated Contact with Patient 01/07/16 1558     Chief Complaint  Patient presents with  . Abdominal Pain   HPI  Ms. Hoopingarner is a 25 year old female with PMHx of GERD presenting with abdominal pain, nausea and vomiting. Pt reports onset of symptoms this morning. She was seen on 5/31 and 5/27 at an outside hospital for same complaints. She describes her abdominal pain as a burning in her epigastric region which radiates into her central chest and throat. She states this pain is similar to her past episodes of GERD but more severe. She reports associated bilious vomiting with one episode PTA. She believes that she vomited her zofran pill up. Denies blood in her vomit. Denies diarrhea or constipation. She states she has never followed up with a gastroenterologist due to insurance issues. She reports taking daily omeprazole for GERD and zofran and oxycodone as needed. She had lap chole performed approximately 3 weeks ago for gallstones. Denies pain, redness, drainage or warmth from her surgical sites. Denies fevers, chills, dizziness, syncope, SOB, GU symptoms.   Chart review: pt [facility] and outside hospitals with similar presentations with diagnosis of gastritis and GERD.   Past Medical History  Diagnosis Date  . GERD (gastroesophageal reflux disease)    Past Surgical History  Procedure Laterality Date  . Cesarean section    . Cholecystectomy     Family History  Problem Relation Age of Onset  . Cancer Mother   . Mental retardation Brother    Social History  Substance Use Topics  . Smoking status: Former Games developer  . Smokeless tobacco: None  . Alcohol Use: No   OB History    Gravida Para Term Preterm AB TAB SAB Ectopic Multiple Living   1              Review of Systems  All other systems reviewed and are negative.     Allergies  Review of patient's allergies indicates no known  allergies.  Home Medications   Prior to Admission medications   Medication Sig Start Date End Date Taking? Authorizing Provider  omeprazole (PRILOSEC) 40 MG capsule Take 40 mg by mouth daily.   Yes Historical Provider, MD  ondansetron (ZOFRAN) 4 MG tablet Take 1 tablet (4 mg total) by mouth every 6 (six) hours. 01/05/16  Yes Raeford Razor, MD  oxycodone (OXY-IR) 5 MG capsule Take 5 mg by mouth every 4 (four) hours as needed for pain.   Yes Historical Provider, MD  ondansetron (ZOFRAN ODT) 4 MG disintegrating tablet Take 1 tablet (4 mg total) by mouth every 8 (eight) hours as needed for nausea or vomiting. 01/07/16   Enzley Kitchens, PA-C  promethazine (PHENERGAN) 25 MG suppository Place 1 suppository (25 mg total) rectally every 6 (six) hours as needed for nausea or vomiting. 01/07/16   Worthington Cohick, PA-C  sucralfate (CARAFATE) 1 GM/10ML suspension Take 10 mLs (1 g total) by mouth 4 (four) times daily -  with meals and at bedtime. 01/07/16   Muhamad Serano, PA-C   BP 144/93 mmHg  Pulse 76  Temp(Src) 98.8 F (37.1 C) (Oral)  Resp 18  Ht 5\' 4"  (1.626 m)  Wt 95.255 kg  BMI 36.03 kg/m2  SpO2 100%  LMP 12/22/2015 Physical Exam  Constitutional: She appears well-developed and well-nourished. No distress.  Nontoxic appearing  HENT:  Head: Normocephalic and atraumatic.  Mouth/Throat: Oropharynx is clear and moist.  Eyes: Conjunctivae are normal. Right eye exhibits no discharge. Left eye exhibits no discharge. No scleral icterus.  Neck: Normal range of motion.  Cardiovascular: Normal rate and regular rhythm.   Pulmonary/Chest: Effort normal. No respiratory distress.  Abdominal: Soft. Normal appearance and bowel sounds are normal. There is tenderness in the epigastric area. There is no rigidity, no guarding and no CVA tenderness.  Abdomen is soft with mild epigastric TTP. No rebound or guarding. Well healed laparotomy sites noted. No TTP, warmth, erythema or purulent drainage from surgical sites.    Musculoskeletal: Normal range of motion.  Neurological: She is alert. Coordination normal.  Skin: Skin is warm and dry.  Psychiatric: She has a normal mood and affect. Her behavior is normal.  Nursing note and vitals reviewed.   ED Course  Procedures (including critical care time) Labs Review Labs Reviewed  CBC - Abnormal; Notable for the following:    HCT 35.5 (*)    All other components within normal limits  COMPREHENSIVE METABOLIC PANEL - Abnormal; Notable for the following:    Potassium 3.0 (*)    BUN 5 (*)    All other components within normal limits  URINALYSIS, ROUTINE W REFLEX MICROSCOPIC (NOT AT [facility]) - Abnormal; Notable for the following:    APPearance CLOUDY (*)    Bilirubin Urine SMALL (*)    Ketones, ur >80 (*)    Leukocytes, UA TRACE (*)    All other components within normal limits  URINE MICROSCOPIC-ADD ON - Abnormal; Notable for the following:    Squamous Epithelial / LPF 6-30 (*)    Bacteria, UA MANY (*)    All other components within normal limits  URINE CULTURE  TROPONIN I  LIPASE, BLOOD  PREGNANCY, URINE    Imaging Review No results found. I have personally reviewed and evaluated these images and lab results as part of my medical decision-making.   EKG Interpretation   Date/Time:  Friday January 07 2016 16:11:54 EDT Ventricular Rate:  65 PR Interval:  111 QRS Duration: 96 QT Interval:  427 QTC Calculation: 444 R Axis:   60 Text Interpretation:  Sinus rhythm Borderline short PR interval Confirmed  by [facility] MD, Barbara Cower [phone]) on 01/07/2016 5:31:01 PM      MDM   Final diagnoses:  Gastroesophageal reflux disease with esophagitis  Epigastric pain  Non-intractable vomiting with nausea, vomiting of unspecified type   25 year old female with PMHx of GERD presenting with burning epigastric and chest pain with associated nausea and vomiting. Pt seen frequently for same presentations. No GI follow up. Afebrile and hemodynamically stable. Abdomen is  soft with mild TTP in epigastric region. No peritoneal signs suggesting surgical abdomen. Well healed laparotomy scars without signs of infection. Heart RRR. Lungs CTAB. No leukocytosis. Potassium 3.0 which was repleted in ED. UA not consistent with infection. Neg preg. Troponin negative with nonischemic ECG. Symptoms treated with zofran, GI cocktail, morphine and reglan. Pt reports improved symptoms. Reviewed chart and outside hospital records. Pt frequently seen with similar complaints. Seems to be an aspect of noncompliance with her daily PPI. Encouraged pt to continue her home meds and added carafate. Given zofran ODT and phenergan suppository for refractory nausea. Strongly encouraged pt to follow up with gastroenterology now that she has insurance. I do not feel this is related to a post op complication and this is acute worsening of her chronic GERD symptoms. Strict return precautions given. Pt is stable for discharge  with outpatient follow up.     Alveta HeimlichStevi Lilliann Rossetti, PA-C 01/07/16 2006  Marily MemosJason Mesner, MD 01/07/16 [phone]

## 2016-01-07 NOTE — Discharge Instructions (Signed)
Schedule a follow up appointment with a gastroenterologist at [facility]. Schedule an appointment with community health and wellness to establish care with a PCP. Return to ED with new, worsening or concerning symptoms.   Abdominal Pain, Adult Many things can cause abdominal pain. Usually, abdominal pain is not caused by a disease and will improve without treatment. It can often be observed and treated at home. Your health care provider will do a physical exam and possibly order blood tests and X-rays to help determine the seriousness of your pain. However, in many cases, more time must pass before a clear cause of the pain can be found. Before that point, your health care provider may not know if you need more testing or further treatment. HOME CARE INSTRUCTIONS Monitor your abdominal pain for any changes. The following actions may help to alleviate any discomfort you are experiencing:  Only take over-the-counter or prescription medicines as directed by your health care provider.  Do not take laxatives unless directed to do so by your health care provider.  Try a clear liquid diet (broth, tea, or water) as directed by your health care provider. Slowly move to a bland diet as tolerated. SEEK MEDICAL CARE IF:  You have unexplained abdominal pain.  You have abdominal pain associated with nausea or diarrhea.  You have pain when you urinate or have a bowel movement.  You experience abdominal pain that wakes you in the night.  You have abdominal pain that is worsened or improved by eating food.  You have abdominal pain that is worsened with eating fatty foods.  You have a fever. SEEK IMMEDIATE MEDICAL CARE IF:  Your pain does not go away within 2 hours.  You keep throwing up (vomiting).  Your pain is felt only in portions of the abdomen, such as the right side or the left lower portion of the abdomen.  You pass bloody or black tarry stools. MAKE SURE YOU:  Understand these  instructions.  Will watch your condition.  Will get help right away if you are not doing well or get worse.   This information is not intended to replace advice given to you by your health care provider. Make sure you discuss any questions you have with your health care provider.   Document Released: 05/03/2005 Document Revised: 04/14/2015 Document Reviewed: 04/02/2013 Elsevier Interactive Patient Education 2016 Elsevier Inc.  Gastroesophageal Reflux Disease, Adult Normally, food travels down the esophagus and stays in the stomach to be digested. However, when a person has gastroesophageal reflux disease (GERD), food and stomach acid move back up into the esophagus. When this happens, the esophagus becomes sore and inflamed. Over time, GERD can create small holes (ulcers) in the lining of the esophagus.  CAUSES This condition is caused by a problem with the muscle between the esophagus and the stomach (lower esophageal sphincter, or LES). Normally, the LES muscle closes after food passes through the esophagus to the stomach. When the LES is weakened or abnormal, it does not close properly, and that allows food and stomach acid to go back up into the esophagus. The LES can be weakened by certain dietary substances, medicines, and medical conditions, including:  Tobacco use.  Pregnancy.  Having a hiatal hernia.  Heavy alcohol use.  Certain foods and beverages, such as coffee, chocolate, onions, and peppermint. RISK FACTORS This condition is more likely to develop in:  People who have an increased body weight.  People who have connective tissue disorders.  People who use  NSAID medicines. SYMPTOMS Symptoms of this condition include:  Heartburn.  Difficult or painful swallowing.  The feeling of having a lump in the throat.  Abitter taste in the mouth.  Bad breath.  Having a large amount of saliva.  Having an upset or bloated stomach.  Belching.  Chest  pain.  Shortness of breath or wheezing.  Ongoing (chronic) cough or a night-time cough.  Wearing away of tooth enamel.  Weight loss. Different conditions can cause chest pain. Make sure to see your health care provider if you experience chest pain. DIAGNOSIS Your health care provider will take a medical history and perform a physical exam. To determine if you have mild or severe GERD, your health care provider may also monitor how you respond to treatment. You may also have other tests, including:  An endoscopy toexamine your stomach and esophagus with a small camera.  A test thatmeasures the acidity level in your esophagus.  A test thatmeasures how much pressure is on your esophagus.  A barium swallow or modified barium swallow to show the shape, size, and functioning of your esophagus. TREATMENT The goal of treatment is to help relieve your symptoms and to prevent complications. Treatment for this condition may vary depending on how severe your symptoms are. Your health care provider may recommend:  Changes to your diet.  Medicine.  Surgery. HOME CARE INSTRUCTIONS Diet  Follow a diet as recommended by your health care provider. This may involve avoiding foods and drinks such as:  Coffee and tea (with or without caffeine).  Drinks that containalcohol.  Energy drinks and sports drinks.  Carbonated drinks or sodas.  Chocolate and cocoa.  Peppermint and mint flavorings.  Garlic and onions.  Horseradish.  Spicy and acidic foods, including peppers, chili powder, curry powder, vinegar, hot sauces, and barbecue sauce.  Citrus fruit juices and citrus fruits, such as oranges, lemons, and limes.  Tomato-based foods, such as red sauce, chili, salsa, and pizza with red sauce.  Fried and fatty foods, such as donuts, french fries, potato chips, and high-fat dressings.  High-fat meats, such as hot dogs and fatty cuts of red and white meats, such as rib eye steak,  sausage, ham, and bacon.  High-fat dairy items, such as whole milk, butter, and cream cheese.  Eat small, frequent meals instead of large meals.  Avoid drinking large amounts of liquid with your meals.  Avoid eating meals during the 2-3 hours before bedtime.  Avoid lying down right after you eat.  Do not exercise right after you eat. General Instructions  Pay attention to any changes in your symptoms.  Take over-the-counter and prescription medicines only as told by your health care provider. Do not take aspirin, ibuprofen, or other NSAIDs unless your health care provider told you to do so.  Do not use any tobacco products, including cigarettes, chewing tobacco, and e-cigarettes. If you need help quitting, ask your health care provider.  Wear loose-fitting clothing. Do not wear anything tight around your waist that causes pressure on your abdomen.  Raise (elevate) the head of your bed 6 inches (15cm).  Try to reduce your stress, such as with yoga or meditation. If you need help reducing stress, ask your health care provider.  If you are overweight, reduce your weight to an amount that is healthy for you. Ask your health care provider for guidance about a safe weight loss goal.  Keep all follow-up visits as told by your health care provider. This is important. SEEK MEDICAL  CARE IF:  You have new symptoms.  You have unexplained weight loss.  You have difficulty swallowing, or it hurts to swallow.  You have wheezing or a persistent cough.  Your symptoms do not improve with treatment.  You have a hoarse voice. SEEK IMMEDIATE MEDICAL CARE IF:  You have pain in your arms, neck, jaw, teeth, or back.  You feel sweaty, dizzy, or light-headed.  You have chest pain or shortness of breath.  You vomit and your vomit looks like blood or coffee grounds.  You faint.  Your stool is bloody or black.  You cannot swallow, drink, or eat.   This information is not intended to  replace advice given to you by your health care provider. Make sure you discuss any questions you have with your health care provider.   Document Released: 05/03/2005 Document Revised: 04/14/2015 Document Reviewed: 11/18/2014 Elsevier Interactive Patient Education 2016 [employer].  Food Choices for Gastroesophageal Reflux Disease, Adult When you have gastroesophageal reflux disease (GERD), the foods you eat and your eating habits are very important. Choosing the right foods can help ease your discomfort.  WHAT GUIDELINES DO I NEED TO FOLLOW?   Choose fruits, vegetables, whole grains, and low-fat dairy products.   Choose low-fat meat, fish, and poultry.  Limit fats such as oils, salad dressings, butter, nuts, and avocado.   Keep a food diary. This helps you identify foods that cause symptoms.   Avoid foods that cause symptoms. These may be different for everyone.   Eat small meals often instead of 3 large meals a day.   Eat your meals slowly, in a place where you are relaxed.   Limit fried foods.   Cook foods using methods other than frying.   Avoid drinking alcohol.   Avoid drinking large amounts of liquids with your meals.   Avoid bending over or lying down until 2-3 hours after eating.  WHAT FOODS ARE NOT RECOMMENDED?  These are some foods and drinks that may make your symptoms worse: Vegetables Tomatoes. Tomato juice. Tomato and spaghetti sauce. Chili peppers. Onion and garlic. Horseradish. Fruits Oranges, grapefruit, and lemon (fruit and juice). Meats High-fat meats, fish, and poultry. This includes hot dogs, ribs, ham, sausage, salami, and bacon. Dairy Whole milk and chocolate milk. Sour cream. Cream. Butter. Ice cream. Cream cheese.  Drinks Coffee and tea. Bubbly (carbonated) drinks or energy drinks. Condiments Hot sauce. Barbecue sauce.  Sweets/Desserts Chocolate and cocoa. Donuts. Peppermint and spearmint. Fats and Oils High-fat foods. This  includes [place] fries and potato chips. Other Vinegar. Strong spices. This includes black pepper, white pepper, red pepper, cayenne, curry powder, cloves, ginger, and chili powder. The items listed above may not be a complete list of foods and drinks to avoid. Contact your dietitian for more information.   This information is not intended to replace advice given to you by your health care provider. Make sure you discuss any questions you have with your health care provider.   Document Released: 01/23/2012 Document Revised: 08/14/2014 Document Reviewed: 05/28/2013 Elsevier Interactive Patient Education [employer]

## 2016-01-07 NOTE — ED Notes (Signed)
Pt is ambulatory to the bathroom.

## 2016-01-08 LAB — URINE CULTURE

## 2016-04-05 ENCOUNTER — Emergency Department (HOSPITAL_BASED_OUTPATIENT_CLINIC_OR_DEPARTMENT_OTHER): Payer: Medicaid Other

## 2016-04-05 ENCOUNTER — Emergency Department (HOSPITAL_BASED_OUTPATIENT_CLINIC_OR_DEPARTMENT_OTHER)
Admission: EM | Admit: 2016-04-05 | Discharge: 2016-04-05 | Disposition: A | Discharge status: Home / Self Care | Payer: Medicaid Other | Attending: Emergency Medicine | Admitting: Emergency Medicine

## 2016-04-05 ENCOUNTER — Encounter (HOSPITAL_BASED_OUTPATIENT_CLINIC_OR_DEPARTMENT_OTHER): Payer: Self-pay

## 2016-04-05 DIAGNOSIS — R1010 Upper abdominal pain, unspecified: Secondary | ICD-10-CM | POA: Insufficient documentation

## 2016-04-05 DIAGNOSIS — R079 Chest pain, unspecified: Secondary | ICD-10-CM | POA: Insufficient documentation

## 2016-04-05 DIAGNOSIS — R112 Nausea with vomiting, unspecified: Secondary | ICD-10-CM

## 2016-04-05 LAB — URINALYSIS, ROUTINE W REFLEX MICROSCOPIC
Glucose, UA: NEGATIVE mg/dL
Ketones, ur: >80 mg/dL — AB
Nitrite: NEGATIVE
PH: 7 (ref 5.0–8.0)
Protein, ur: NEGATIVE mg/dL
SPECIFIC GRAVITY, URINE: 1.029 (ref 1.005–1.030)

## 2016-04-05 LAB — CBC
HCT: 38.5 % (ref 36.0–46.0)
Hemoglobin: 13.7 g/dL (ref 12.0–15.0)
MCH: 30.2 pg (ref 26.0–34.0)
MCHC: 35.6 g/dL (ref 30.0–36.0)
MCV: 85 fL (ref 78.0–100.0)
PLATELETS: 289 10*3/uL (ref 150–400)
RBC: 4.53 MIL/uL (ref 3.87–5.11)
RDW: 12.9 % (ref 11.5–15.5)
WBC: 8.2 10*3/uL (ref 4.0–10.5)

## 2016-04-05 LAB — COMPREHENSIVE METABOLIC PANEL
ALK PHOS: 62 U/L (ref 38–126)
ALT: 43 U/L (ref 14–54)
AST: 34 U/L (ref 15–41)
Albumin: 4.4 g/dL (ref 3.5–5.0)
Anion gap: 11 (ref 5–15)
BUN: 12 mg/dL (ref 6–20)
CALCIUM: 9.5 mg/dL (ref 8.9–10.3)
CO2: 26 mmol/L (ref 22–32)
CREATININE: 0.83 mg/dL (ref 0.44–1.00)
Chloride: 98 mmol/L — ABNORMAL LOW (ref 101–111)
Glucose, Bld: 87 mg/dL (ref 65–99)
Potassium: 3.1 mmol/L — ABNORMAL LOW (ref 3.5–5.1)
Sodium: 135 mmol/L (ref 135–145)
TOTAL PROTEIN: 8.4 g/dL — AB (ref 6.5–8.1)
Total Bilirubin: 0.9 mg/dL (ref 0.3–1.2)

## 2016-04-05 LAB — URINE MICROSCOPIC-ADD ON

## 2016-04-05 LAB — PREGNANCY, URINE: Preg Test, Ur: NEGATIVE

## 2016-04-05 LAB — LIPASE, BLOOD: LIPASE: 35 U/L (ref 11–51)

## 2016-04-05 MED ORDER — SODIUM CHLORIDE 0.9 % IV BOLUS (SEPSIS)
2000.0000 mL | Freq: Once | INTRAVENOUS | Status: AC
  Administered 2016-04-05: 2000 mL via INTRAVENOUS

## 2016-04-05 MED ORDER — ONDANSETRON HCL 4 MG/2ML IJ SOLN
4.0000 mg | Freq: Once | INTRAMUSCULAR | Status: AC | PRN
  Administered 2016-04-05: 4 mg via INTRAVENOUS
  Filled 2016-04-05: qty 2

## 2016-04-05 MED ORDER — HYDROMORPHONE HCL 1 MG/ML IJ SOLN
1.0000 mg | Freq: Once | INTRAMUSCULAR | Status: AC
Start: 2016-04-05 — End: 2016-04-05
  Administered 2016-04-05: 1 mg via INTRAVENOUS
  Filled 2016-04-05: qty 1

## 2016-04-05 MED ORDER — POTASSIUM CHLORIDE CRYS ER 20 MEQ PO TBCR
40.0000 meq | EXTENDED_RELEASE_TABLET | Freq: Once | ORAL | Status: AC
  Administered 2016-04-05: 40 meq via ORAL
  Filled 2016-04-05: qty 2

## 2016-04-05 MED ORDER — PANTOPRAZOLE SODIUM 40 MG PO TBEC: 40.0000 mg | DELAYED_RELEASE_TABLET | Freq: Every day | ORAL | 0 refills | Status: DC

## 2016-04-05 MED ORDER — METOCLOPRAMIDE HCL 5 MG/ML IJ SOLN
10.0000 mg | Freq: Once | INTRAMUSCULAR | Status: AC
  Administered 2016-04-05: 10 mg via INTRAVENOUS
  Filled 2016-04-05: qty 2

## 2016-04-05 MED ORDER — POTASSIUM CHLORIDE CRYS ER 20 MEQ PO TBCR: 20.0000 meq | EXTENDED_RELEASE_TABLET | Freq: Every day | ORAL | 0 refills | Status: DC

## 2016-04-05 MED ORDER — DIPHENHYDRAMINE HCL 50 MG/ML IJ SOLN
25.0000 mg | Freq: Once | INTRAMUSCULAR | Status: AC
  Administered 2016-04-05: 25 mg via INTRAVENOUS
  Filled 2016-04-05: qty 1

## 2016-04-05 MED ORDER — METOCLOPRAMIDE HCL 10 MG PO TABS: 10.0000 mg | ORAL_TABLET | Freq: Three times a day (TID) | ORAL | 0 refills | Status: DC | PRN

## 2016-04-05 NOTE — ED Notes (Signed)
Patient transported to X-ray 

## 2016-04-05 NOTE — ED Provider Notes (Signed)
[facility]   CSN: [id number] Arrival date & time: 04/05/16  1651     History   Chief Complaint Chief Complaint  Patient presents with  . Emesis    HPI Victoria Smith is a 25 y.o. female presenting with recurrent nausea, vomiting, and abdominal pain. States she has a history of GERD and a few months ago had a cholecystectomy. However now over the last 2-3 days she's been having recurrent nausea and vomiting that is similar to multiple prior episodes. Has epigastric pain although it up to her chest. This started after her vomiting started. She states she is vomiting "acid". No blood. No diarrhea. Last bowel movement last night.   HPI  Past Medical History:  Diagnosis Date  . GERD (gastroesophageal reflux disease)     There are no active problems to display for this patient.   Past Surgical History:  Procedure Laterality Date  . CESAREAN SECTION    . CHOLECYSTECTOMY      OB History    Gravida Para Term Preterm AB Living   1             SAB TAB Ectopic Multiple Live Births                   Home Medications    Prior to Admission medications   Medication Sig Start Date End Date Taking? Authorizing Provider  metoCLOPramide (REGLAN) 10 MG tablet Take 1 tablet (10 mg total) by mouth every 8 (eight) hours as needed for nausea or vomiting. 04/05/16   Pricilla LovelessScott Jaye Saal, MD  omeprazole (PRILOSEC) 40 MG capsule Take 40 mg by mouth daily.    Historical Provider, MD  ondansetron (ZOFRAN ODT) 4 MG disintegrating tablet Take 1 tablet (4 mg total) by mouth every 8 (eight) hours as needed for nausea or vomiting. 01/07/16   Stevi Barrett, PA-C  ondansetron (ZOFRAN) 4 MG tablet Take 1 tablet (4 mg total) by mouth every 6 (six) hours. 01/05/16   Raeford RazorStephen Kohut, MD  oxycodone (OXY-IR) 5 MG capsule Take 5 mg by mouth every 4 (four) hours as needed for pain.    Historical Provider, MD  pantoprazole (PROTONIX) 40 MG tablet Take 1 tablet (40 mg total) by mouth daily.  04/05/16   Pricilla LovelessScott Pebble Botkin, MD  potassium chloride SA (K-DUR,KLOR-CON) 20 MEQ tablet Take 1 tablet (20 mEq total) by mouth daily. 04/05/16   Pricilla LovelessScott Nehemyah Foushee, MD  promethazine (PHENERGAN) 25 MG suppository Place 1 suppository (25 mg total) rectally every 6 (six) hours as needed for nausea or vomiting. 01/07/16   Stevi Barrett, PA-C  sucralfate (CARAFATE) 1 GM/10ML suspension Take 10 mLs (1 g total) by mouth 4 (four) times daily -  with meals and at bedtime. 01/07/16   Rolm GalaStevi Barrett, PA-C    Family History Family History  Problem Relation Age of Onset  . Cancer Mother   . Mental retardation Brother     Social History Social History  Substance Use Topics  . Smoking status: Former Games developermoker  . Smokeless tobacco: Never Used  . Alcohol use No     Allergies   Review of patient's allergies indicates no known allergies.   Review of Systems Review of Systems  Constitutional: Negative for fever.  Respiratory: Negative for shortness of breath.   Cardiovascular: Positive for chest pain.  Gastrointestinal: Positive for abdominal pain, nausea and vomiting. Negative for constipation and diarrhea.  Genitourinary: Negative for dysuria.  All other systems reviewed and are negative.  Physical Exam Updated Vital Signs BP 119/75   Pulse 65   Temp 98.5 F (36.9 C) (Oral)   Resp 19   Ht 5\' 4"  (1.626 m)   Wt 195 lb (88.5 kg)   LMP 03/22/2016   SpO2 100%   BMI 33.47 kg/m   Physical Exam  Constitutional: She is oriented to person, place, and time. She appears well-developed and well-nourished.  HENT:  Head: Normocephalic and atraumatic.  Right Ear: External ear normal.  Left Ear: External ear normal.  Nose: Nose normal.  Mouth/Throat: Mucous membranes are dry.  Eyes: Right eye exhibits no discharge. Left eye exhibits no discharge.  Cardiovascular: Normal rate, regular rhythm and normal heart sounds.   Pulmonary/Chest: Effort normal and breath sounds normal.    No crepitus  Abdominal:  Soft. There is tenderness (mild) in the right upper quadrant, epigastric area and left upper quadrant.  Neurological: She is alert and oriented to person, place, and time.  Skin: Skin is warm and dry.  Nursing note and vitals reviewed.    ED Treatments / Results  Labs (all labs ordered are listed, but only abnormal results are displayed) Labs Reviewed  URINALYSIS, ROUTINE W REFLEX MICROSCOPIC (NOT AT [facility]) - Abnormal; Notable for the following:       Result Value   Color, Urine AMBER (*)    APPearance CLOUDY (*)    Hgb urine dipstick MODERATE (*)    Bilirubin Urine MODERATE (*)    Ketones, ur >80 (*)    Leukocytes, UA MODERATE (*)    All other components within normal limits  COMPREHENSIVE METABOLIC PANEL - Abnormal; Notable for the following:    Potassium 3.1 (*)    Chloride 98 (*)    Total Protein 8.4 (*)    All other components within normal limits  URINE MICROSCOPIC-ADD ON - Abnormal; Notable for the following:    Squamous Epithelial / LPF 6-30 (*)    Bacteria, UA FEW (*)    All other components within normal limits  PREGNANCY, URINE  LIPASE, BLOOD  CBC    EKG  EKG Interpretation None       Radiology Dg Chest 2 View  Result Date: 04/05/2016 CLINICAL DATA:  Vomiting and chest pain EXAM: CHEST  2 VIEW COMPARISON:  10/11/2015 chest radiograph. FINDINGS: Stable cardiomediastinal silhouette with normal heart size. No pneumothorax. No pleural effusion. Lungs appear clear, with no acute consolidative airspace disease and no pulmonary edema. Cholecystectomy clips are seen in the right upper quadrant of the abdomen. IMPRESSION: No active cardiopulmonary disease. Electronically Signed   By: Delbert Phenix M.D.   On: 04/05/2016 18:13    Procedures Procedures (including critical care time)  Medications Ordered in ED Medications  ondansetron (ZOFRAN) injection 4 mg (4 mg Intravenous Given 04/05/16 1717)  sodium chloride 0.9 % bolus 2,000 mL (0 mLs Intravenous Stopped 04/05/16  1841)  HYDROmorphone (DILAUDID) injection 1 mg (1 mg Intravenous Given 04/05/16 1800)  metoCLOPramide (REGLAN) injection 10 mg (10 mg Intravenous Given 04/05/16 1757)  diphenhydrAMINE (BENADRYL) injection 25 mg (25 mg Intravenous Given 04/05/16 1756)  potassium chloride SA (K-DUR,KLOR-CON) CR tablet 40 mEq (40 mEq Oral Given 04/05/16 1818)     Initial Impression / Assessment and Plan / ED Course  I have reviewed the triage vital signs and the nursing notes.  Pertinent labs & imaging results that were available during my care of the patient were reviewed by me and considered in my medical decision making (see chart for details).  Clinical Course  Comment By Time  There is mild epigastric tenderness but I do not think imaging is needed at this time. Has happened many times before. Possibly a gastroparesis component. Will try reglan, dilaudid, benadryl, fluids. Will need referral to GI Pricilla Loveless, MD 08/30 1737  Patient feels much better. Tolerating oral fluids. No further nausea, vomiting, or abdominal pain. She did not have any urinary symptoms in her urine looks contaminated. Doubt UTI. Will refer to GI. Pricilla Loveless, MD 08/30 1836    Final Clinical Impressions(s) / ED Diagnoses   Final diagnoses:  Nausea and vomiting in adult  Upper abdominal pain    New Prescriptions Discharge Medication List as of 04/05/2016  6:36 PM    START taking these medications   Details  metoCLOPramide (REGLAN) 10 MG tablet Take 1 tablet (10 mg total) by mouth every 8 (eight) hours as needed for nausea or vomiting., Starting Wed 04/05/2016, Print    pantoprazole (PROTONIX) 40 MG tablet Take 1 tablet (40 mg total) by mouth daily., Starting Wed 04/05/2016, Print    potassium chloride SA (K-DUR,KLOR-CON) 20 MEQ tablet Take 1 tablet (20 mEq total) by mouth daily., Starting Wed 04/05/2016, Print         Pricilla Loveless, MD 04/05/16 2159

## 2016-04-05 NOTE — ED Notes (Signed)
MD at bedside. 

## 2016-04-05 NOTE — ED Triage Notes (Signed)
C/o n/v x 2-3 days

## 2016-08-22 ENCOUNTER — Other Ambulatory Visit (HOSPITAL_COMMUNITY): Payer: Self-pay | Admitting: Specialist

## 2016-08-22 DIAGNOSIS — Z3A18 18 weeks gestation of pregnancy: Secondary | ICD-10-CM

## 2016-08-22 DIAGNOSIS — Z8759 Personal history of other complications of pregnancy, childbirth and the puerperium: Secondary | ICD-10-CM

## 2016-08-22 DIAGNOSIS — Z3689 Encounter for other specified antenatal screening: Secondary | ICD-10-CM

## 2016-09-21 ENCOUNTER — Encounter (HOSPITAL_COMMUNITY): Payer: Self-pay | Admitting: *Deleted

## 2016-09-22 ENCOUNTER — Encounter (HOSPITAL_COMMUNITY): Payer: Self-pay

## 2016-09-22 ENCOUNTER — Other Ambulatory Visit (HOSPITAL_COMMUNITY): Payer: Self-pay | Admitting: Specialist

## 2016-09-22 ENCOUNTER — Ambulatory Visit (HOSPITAL_COMMUNITY)
Admission: RE | Admit: 2016-09-22 | Discharge: 2016-09-22 | Disposition: A | Discharge status: Home / Self Care | Payer: Medicaid Other | Source: Ambulatory Visit | Attending: Specialist | Admitting: Specialist

## 2016-09-22 ENCOUNTER — Other Ambulatory Visit (HOSPITAL_COMMUNITY): Payer: Self-pay | Admitting: *Deleted

## 2016-09-22 ENCOUNTER — Other Ambulatory Visit: Payer: Self-pay

## 2016-09-22 DIAGNOSIS — Z3A17 17 weeks gestation of pregnancy: Secondary | ICD-10-CM | POA: Insufficient documentation

## 2016-09-22 DIAGNOSIS — O09212 Supervision of pregnancy with history of pre-term labor, second trimester: Secondary | ICD-10-CM | POA: Diagnosis not present

## 2016-09-22 DIAGNOSIS — O09892 Supervision of other high risk pregnancies, second trimester: Secondary | ICD-10-CM

## 2016-09-22 DIAGNOSIS — Z3A18 18 weeks gestation of pregnancy: Secondary | ICD-10-CM

## 2016-09-22 DIAGNOSIS — Z8759 Personal history of other complications of pregnancy, childbirth and the puerperium: Secondary | ICD-10-CM

## 2016-09-22 DIAGNOSIS — Z363 Encounter for antenatal screening for malformations: Secondary | ICD-10-CM

## 2016-09-22 DIAGNOSIS — O34211 Maternal care for low transverse scar from previous cesarean delivery: Secondary | ICD-10-CM | POA: Diagnosis not present

## 2016-09-22 DIAGNOSIS — Z3689 Encounter for other specified antenatal screening: Secondary | ICD-10-CM

## 2016-09-22 DIAGNOSIS — O09292 Supervision of pregnancy with other poor reproductive or obstetric history, second trimester: Secondary | ICD-10-CM

## 2016-09-22 NOTE — Consult Note (Signed)
Maternal Fetal Medicine Consultation  Requesting Provider(s): Dr. Arther Abbott  Reason for consultation: Hx of PROM / 33 week delivery  HPI: Victoria Smith is a 26 yo G3P1102 seen for consultation due to a history of a previous 33 week delivery / PROM.  Her past OB history is as follows:  G1: 2014 - term C-section for arrest of dilation G2 - 2016 - PROM at ~ [redacted] weeks gestation.  Delivered at 33 weeks via SVD without complications  Victoria Smith's prenatal course has otherwise been uncomplicated.  She had a drug screen early in pregnancy that returned positive for Marijuana - but denies any drug use since that time.  She is without complaints today.  OB History: OB History    Gravida Para Term Preterm AB Living   3 2 1 1   2    SAB TAB Ectopic Multiple Live Births                  PMH:  Past Medical History:  Diagnosis Date  . GERD (gastroesophageal reflux disease)     PSH:  Past Surgical History:  Procedure Laterality Date  . CESAREAN SECTION    . CHOLECYSTECTOMY     Meds:  Current Outpatient Prescriptions on File Prior to Encounter  Medication Sig Dispense Refill  . metoCLOPramide (REGLAN) 10 MG tablet Take 1 tablet (10 mg total) by mouth every 8 (eight) hours as needed for nausea or vomiting. (Patient not taking: Reported on 09/22/2016) 10 tablet 0  . omeprazole (PRILOSEC) 40 MG capsule Take 40 mg by mouth daily.    . ondansetron (ZOFRAN ODT) 4 MG disintegrating tablet Take 1 tablet (4 mg total) by mouth every 8 (eight) hours as needed for nausea or vomiting. 20 tablet 0  . ondansetron (ZOFRAN) 4 MG tablet Take 1 tablet (4 mg total) by mouth every 6 (six) hours. 20 tablet 0  . oxycodone (OXY-IR) 5 MG capsule Take 5 mg by mouth every 4 (four) hours as needed for pain.    . pantoprazole (PROTONIX) 40 MG tablet Take 1 tablet (40 mg total) by mouth daily. (Patient not taking: Reported on 09/22/2016) 14 tablet 0  . potassium chloride SA (K-DUR,KLOR-CON) 20 MEQ tablet Take 1 tablet (20  mEq total) by mouth daily. 3 tablet 0  . Prenatal Vit-Fe Fumarate-FA (PRENATAL VITAMIN PO) Take by mouth.    . promethazine (PHENERGAN) 25 MG suppository Place 1 suppository (25 mg total) rectally every 6 (six) hours as needed for nausea or vomiting. (Patient not taking: Reported on 09/22/2016) 12 each 0  . sucralfate (CARAFATE) 1 GM/10ML suspension Take 10 mLs (1 g total) by mouth 4 (four) times daily -  with meals and at bedtime. 420 mL 0  . [DISCONTINUED] famotidine (PEPCID) 20 MG tablet Take 2 tablets (40 mg total) by mouth daily. 10 tablet 0   No current facility-administered medications on file prior to encounter.    Allergies: No Known Allergies   FH:  Family History  Problem Relation Age of Onset  . Cancer Mother   . Mental retardation Brother    Soc:  Social History   Social History  . Marital status: Single    Spouse name: N/A  . Number of children: N/A  . Years of education: N/A   Occupational History  . Not on file.   Social History Main Topics  . Smoking status: Former Games developer  . Smokeless tobacco: Never Used  . Alcohol use No  . Drug use: No  .  Sexual activity: Not on file   Other Topics Concern  . Not on file   Social History Narrative  . No narrative on file    Review of Systems: no vaginal bleeding or cramping/contractions, no LOF, no nausea/vomiting. All other systems reviewed and are negative.  PE:  213.4 lbs, 125/74, 73  GEN: well-appearing female ABD: gravid, NT  Ultrasound:  Single IUP at 17w 6d Bilateral choroid plexus cysts (see comments) The remainder of the fetal anatomy appears normal Open hands were visualized Posterior fundal placenta Ultrasound measurements are consistent with LMP Normal amniotic fluid volume  TVUS - cervical length 4.9 cm without funneling or dynamic changes  A/P: 1) Single IUP at 17w 6d  2) Hx of PROM with 33 week delivery - Victoria Smith is a candidate for Makena (17-P) injections. Would should be initiated at  16-[redacted] weeks gestation and requires weekly injections until [redacted] weeks gestation.  Additionally, would recommend cervical length screening until [redacted] weeks gestation.  I have taken the liberty to schedule the patient for follow up every 2 weeks until 24 weeks for cervical lengths.  3) Choroid plexus cysts - This is a common variant (1-2%) of all pregnancies, and carries a small association with Trisomy 18.  However, there were no other ultrasound stigmata of Trisomy 18, making the possibility of Trisomy 18 less likely. Open hands were visualized which also makes the diagnosis of Trisomy 18 unlikely.  The patient reports that she had a Quad screen drawn - results currently pending.  If this result is negative, no further follow up is required for this indication.  4) Hx of previous C-section - had subsequent successful VBAC with second pregnancy.    5) History of marijuana use - denies any additional drug use during pregnancy  Thank you for the opportunity to be a part of the care of Victoria Smith. Please contact our office if we can be of further assistance.   I spent approximately 30 minutes with this patient with over 50% of time spent in face-to-face counseling.  Alpha GulaPaul Kenyona Rena, MD Maternal Fetal Medicine

## 2016-10-06 ENCOUNTER — Ambulatory Visit (HOSPITAL_COMMUNITY): Payer: Medicaid Other

## 2016-10-20 ENCOUNTER — Ambulatory Visit (HOSPITAL_COMMUNITY): Payer: Medicaid Other

## 2016-11-03 ENCOUNTER — Ambulatory Visit (HOSPITAL_COMMUNITY): Payer: Medicaid Other

## 2017-06-06 ENCOUNTER — Encounter (HOSPITAL_BASED_OUTPATIENT_CLINIC_OR_DEPARTMENT_OTHER): Payer: Self-pay | Admitting: Emergency Medicine

## 2017-06-06 ENCOUNTER — Emergency Department (HOSPITAL_BASED_OUTPATIENT_CLINIC_OR_DEPARTMENT_OTHER)
Admission: EM | Admit: 2017-06-06 | Discharge: 2017-06-06 | Disposition: A | Discharge status: Home / Self Care | Payer: Self-pay | Attending: Emergency Medicine | Admitting: Emergency Medicine

## 2017-06-06 DIAGNOSIS — H9201 Otalgia, right ear: Secondary | ICD-10-CM

## 2017-06-06 DIAGNOSIS — Z87891 Personal history of nicotine dependence: Secondary | ICD-10-CM | POA: Insufficient documentation

## 2017-06-06 DIAGNOSIS — H6091 Unspecified otitis externa, right ear: Secondary | ICD-10-CM | POA: Insufficient documentation

## 2017-06-06 DIAGNOSIS — H60501 Unspecified acute noninfective otitis externa, right ear: Secondary | ICD-10-CM

## 2017-06-06 DIAGNOSIS — Z79899 Other long term (current) drug therapy: Secondary | ICD-10-CM | POA: Insufficient documentation

## 2017-06-06 MED ORDER — OFLOXACIN 0.3 % OT SOLN
10.0000 [drp] | Freq: Every day | OTIC | 0 refills | Status: AC
Start: 2017-06-06 — End: 2017-06-13

## 2017-06-06 NOTE — Discharge Instructions (Signed)
Use eardrops for the next 7 days. If symptoms are not improving please follow-up with your primary doctor. Ibuprofen or Tylenol for pain. Avoid using Q-tips deep in the ER, you can use hydrogen peroxide and rubbing alcohol mixed together to help with wax or sensation of water in her ear. Return for sooner evaluation if you have worsening ear pain with redness of the ear, fevers or chills or tenderness behind the ear.

## 2017-06-06 NOTE — ED Triage Notes (Signed)
Pt c/o RT ear pain x 2 days; sts she stuck a Q-tip in too far a couple weeks ago

## 2017-06-06 NOTE — ED Provider Notes (Signed)
[facility] EMERGENCY DEPARTMENT Provider Note   CSN: [id number] Arrival date & time: 06/06/17  0915     History   Chief Complaint Chief Complaint  Patient presents with  . Otalgia    HPI  Victoria Smith is a 26 y.o. Female With no pertinent past medical history, presents complaining of pain in her right ear irritated for the past 3 days. Patient reports sticking a Q-tip too far into her ear a few weeks ago when she felt like she had some wax in water in her ear, after this the ear became painful and irritated. Patient reports she had read on the Internet that putting some Olive oil in the ear can help so she tried this and initially the pain improved, but since Sunday she's had worsening throbbing pain in the ear, she reports pain with manipulation of the ear. Reports she had a cotton ball with some olive oil near and saw very small amount of blood on it yesterday and was concerned. No hearing loss or tinnitus. She denies any redness or swelling of the ear or surrounding skin. No fevers or chills, no associated nasal congestion, rhinorrhea, sore throat.      Past Medical History:  Diagnosis Date  . GERD (gastroesophageal reflux disease)     There are no active problems to display for this patient.   Past Surgical History:  Procedure Laterality Date  . CESAREAN SECTION    . CHOLECYSTECTOMY    . TUBAL LIGATION      OB History    Gravida Para Term Preterm AB Living   3 2 1 1   2    SAB TAB Ectopic Multiple Live Births                   Home Medications    Prior to Admission medications   Medication Sig Start Date End Date Taking? Authorizing Provider  metoCLOPramide (REGLAN) 10 MG tablet Take 1 tablet (10 mg total) by mouth every 8 (eight) hours as needed for nausea or vomiting. Patient not taking: Reported on 09/22/2016 04/05/16   Pricilla Loveless, MD  omeprazole (PRILOSEC) 40 MG capsule Take 40 mg by mouth daily.    [provider]  ondansetron  (ZOFRAN ODT) 4 MG disintegrating tablet Take 1 tablet (4 mg total) by mouth every 8 (eight) hours as needed for nausea or vomiting. 01/07/16   Barrett, Rolm Gala, PA-C  ondansetron (ZOFRAN) 4 MG tablet Take 1 tablet (4 mg total) by mouth every 6 (six) hours. 01/05/16   Raeford Razor, MD  oxycodone (OXY-IR) 5 MG capsule Take 5 mg by mouth every 4 (four) hours as needed for pain.    [provider]  pantoprazole (PROTONIX) 40 MG tablet Take 1 tablet (40 mg total) by mouth daily. Patient not taking: Reported on 09/22/2016 04/05/16   Pricilla Loveless, MD  potassium chloride SA (K-DUR,KLOR-CON) 20 MEQ tablet Take 1 tablet (20 mEq total) by mouth daily. 04/05/16   Pricilla Loveless, MD  Prenatal Vit-Fe Fumarate-FA (PRENATAL VITAMIN PO) Take by mouth.    [provider]  promethazine (PHENERGAN) 25 MG suppository Place 1 suppository (25 mg total) rectally every 6 (six) hours as needed for nausea or vomiting. Patient not taking: Reported on 09/22/2016 01/07/16   Barrett, Rolm Gala, PA-C  sucralfate (CARAFATE) 1 GM/10ML suspension Take 10 mLs (1 g total) by mouth 4 (four) times daily -  with meals and at bedtime. 01/07/16   Barrett, Rolm Gala, PA-C    Family History  Family History  Problem Relation Age of Onset  . Cancer Mother   . Mental retardation Brother     Social History Social History  Substance Use Topics  . Smoking status: Former Games developermoker  . Smokeless tobacco: Never Used  . Alcohol use No     Allergies   Patient has no known allergies.   Review of Systems Review of Systems  Constitutional: Negative for chills and fever.  HENT: Positive for ear pain. Negative for congestion, ear discharge, hearing loss, postnasal drip, rhinorrhea, sinus pressure, sore throat, tinnitus and voice change.   Eyes: Negative for pain and discharge.  Respiratory: Negative for cough.   Skin: Negative for color change and rash.     Physical Exam Updated Vital Signs BP 127/83 (BP Location: Right Arm)   Pulse  68   Temp 98.6 F (37 C) (Oral)   Resp 16   Ht 5\' 4"  (1.626 m)   Wt 104.3 kg (230 lb)   LMP 05/16/2017   SpO2 100%   Breastfeeding? Unknown   BMI 39.48 kg/m   Physical Exam  Constitutional: She appears well-developed and well-nourished. No distress.  HENT:  Head: Normocephalic and atraumatic.  Right Ear: Hearing and tympanic membrane normal. There is tenderness. No drainage or swelling. No foreign bodies. No mastoid tenderness.  Left Ear: Hearing, tympanic membrane, external ear and ear canal normal.  Nose: Nose normal.  Mouth/Throat: Oropharynx is clear and moist.  Right ear tender with manipulation of the tragus and auricle, EAC erythematous, minimal edema,  with moderate amount of wax present, full TM visualized, no erythema, good cone of light and bony landmarks visible. No surrounding cellulitis, no tenderness at the mastoid. Left ear normal, TM clear with good landmarks and cone of light.   Eyes: Right eye exhibits no discharge. Left eye exhibits no discharge.  Pulmonary/Chest: Effort normal. No respiratory distress.  Neurological: She is alert. Coordination normal.  Skin: Skin is warm and dry. She is not diaphoretic.  Psychiatric: She has a normal mood and affect. Her behavior is normal.  Nursing note and vitals reviewed.    ED Treatments / Results  Labs (all labs ordered are listed, but only abnormal results are displayed) Labs Reviewed - No data to display  EKG  EKG Interpretation None       Radiology No results found.  Procedures Procedures (including critical care time)  Medications Ordered in ED Medications - No data to display   Initial Impression / Assessment and Plan / ED Course  I have reviewed the triage vital signs and the nursing notes.  Pertinent labs & imaging results that were available during my care of the patient were reviewed by me and considered in my medical decision making (see chart for details).  Otitis externa  Pt presenting  with otitis externa. No canal occlusion, Pt afebrile in NAD. Exam non concerning for mastoiditis, cellulitis or malignant OE. Dc with ofloxacin script.  Advised PCP follow-up pt if symptoms not improving after a few days, or if pt develops hearing loss. Counseled pt on safe use of Q-tips and using hydrogen peroxide in ear for wax.  Final Clinical Impressions(s) / ED Diagnoses   Final diagnoses:  Otalgia of right ear  Acute otitis externa of right ear, unspecified type    New Prescriptions New Prescriptions   OFLOXACIN (FLOXIN) 0.3 % OTIC SOLUTION    Place 10 drops into the right ear daily.     Dartha LodgeFord, Alisse Tuite N, [place] JerseyPA-C 06/07/17 1553  Melene Plan, DO 06/07/17 [id number]

## 2017-06-07 MED FILL — OFLOXACIN 0.3% EYE DROPS: 0.3 | 8 days supply | Qty: 5 | Fill #0

## 2017-11-18 ENCOUNTER — Emergency Department (HOSPITAL_BASED_OUTPATIENT_CLINIC_OR_DEPARTMENT_OTHER)
Admission: EM | Admit: 2017-11-18 | Discharge: 2017-11-19 | Disposition: A | Discharge status: Home / Self Care | Payer: Self-pay | Attending: Emergency Medicine | Admitting: Emergency Medicine

## 2017-11-18 ENCOUNTER — Encounter (HOSPITAL_BASED_OUTPATIENT_CLINIC_OR_DEPARTMENT_OTHER): Payer: Self-pay | Admitting: Adult Health

## 2017-11-18 ENCOUNTER — Other Ambulatory Visit: Payer: Self-pay

## 2017-11-18 DIAGNOSIS — K219 Gastro-esophageal reflux disease without esophagitis: Secondary | ICD-10-CM | POA: Insufficient documentation

## 2017-11-18 DIAGNOSIS — R740 Nonspecific elevation of levels of transaminase and lactic acid dehydrogenase [LDH]: Secondary | ICD-10-CM | POA: Insufficient documentation

## 2017-11-18 DIAGNOSIS — R112 Nausea with vomiting, unspecified: Secondary | ICD-10-CM

## 2017-11-18 DIAGNOSIS — Z87891 Personal history of nicotine dependence: Secondary | ICD-10-CM | POA: Insufficient documentation

## 2017-11-18 DIAGNOSIS — R7401 Elevation of levels of liver transaminase levels: Secondary | ICD-10-CM

## 2017-11-18 DIAGNOSIS — Z79899 Other long term (current) drug therapy: Secondary | ICD-10-CM | POA: Insufficient documentation

## 2017-11-18 LAB — CBC WITH DIFFERENTIAL/PLATELET
BASOS ABS: 0 10*3/uL (ref 0.0–0.1)
Basophils Relative: 0 %
EOS ABS: 0 10*3/uL (ref 0.0–0.7)
EOS PCT: 0 %
HCT: 39.5 % (ref 36.0–46.0)
Hemoglobin: 13.9 g/dL (ref 12.0–15.0)
Lymphocytes Relative: 19 %
Lymphs Abs: 2.4 10*3/uL (ref 0.7–4.0)
MCH: 29.3 pg (ref 26.0–34.0)
MCHC: 35.2 g/dL (ref 30.0–36.0)
MCV: 83.2 fL (ref 78.0–100.0)
Monocytes Absolute: 0.9 10*3/uL (ref 0.1–1.0)
Monocytes Relative: 7 %
Neutro Abs: 9 10*3/uL — ABNORMAL HIGH (ref 1.7–7.7)
Neutrophils Relative %: 74 %
PLATELETS: 345 10*3/uL (ref 150–400)
RBC: 4.75 MIL/uL (ref 3.87–5.11)
RDW: 13.1 % (ref 11.5–15.5)
WBC: 12.4 10*3/uL — ABNORMAL HIGH (ref 4.0–10.5)

## 2017-11-18 LAB — COMPREHENSIVE METABOLIC PANEL
ALT: 100 U/L — ABNORMAL HIGH (ref 14–54)
AST: 45 U/L — AB (ref 15–41)
Albumin: 4.4 g/dL (ref 3.5–5.0)
Alkaline Phosphatase: 82 U/L (ref 38–126)
Anion gap: 13 (ref 5–15)
BUN: 10 mg/dL (ref 6–20)
CHLORIDE: 102 mmol/L (ref 101–111)
CO2: 22 mmol/L (ref 22–32)
Calcium: 9.6 mg/dL (ref 8.9–10.3)
Creatinine, Ser: 0.71 mg/dL (ref 0.44–1.00)
GFR calc non Af Amer: >60 mL/min (ref >60)
Glucose, Bld: 85 mg/dL (ref 65–99)
Potassium: 2.8 mmol/L — ABNORMAL LOW (ref 3.5–5.1)
SODIUM: 137 mmol/L (ref 135–145)
Total Bilirubin: 0.9 mg/dL (ref 0.3–1.2)
Total Protein: 8.5 g/dL — ABNORMAL HIGH (ref 6.5–8.1)

## 2017-11-18 LAB — PREGNANCY, URINE: PREG TEST UR: NEGATIVE

## 2017-11-18 LAB — LIPASE, BLOOD: Lipase: 35 U/L (ref 11–51)

## 2017-11-18 MED ORDER — POTASSIUM CHLORIDE 10 MEQ/100ML IV SOLN
10.0000 meq | Freq: Once | INTRAVENOUS | Status: AC
Start: 2017-11-18 — End: 2017-11-19
  Administered 2017-11-18: 10 meq via INTRAVENOUS
  Filled 2017-11-18: qty 100

## 2017-11-18 MED ORDER — ONDANSETRON 4 MG PO TBDP
4.0000 mg | ORAL_TABLET | Freq: Once | ORAL | Status: AC
  Administered 2017-11-18: 4 mg via ORAL
  Filled 2017-11-18: qty 1

## 2017-11-18 MED ORDER — METOCLOPRAMIDE HCL 5 MG/ML IJ SOLN
10.0000 mg | Freq: Once | INTRAMUSCULAR | Status: AC
  Administered 2017-11-18: 10 mg via INTRAVENOUS
  Filled 2017-11-18: qty 2

## 2017-11-18 MED ORDER — DIPHENHYDRAMINE HCL 50 MG/ML IJ SOLN
25.0000 mg | Freq: Once | INTRAMUSCULAR | Status: AC
Start: 2017-11-18 — End: 2017-11-18
  Administered 2017-11-18: 25 mg via INTRAVENOUS
  Filled 2017-11-18: qty 1

## 2017-11-18 MED ORDER — ONDANSETRON HCL 4 MG/2ML IJ SOLN
4.0000 mg | Freq: Once | INTRAMUSCULAR | Status: AC
  Administered 2017-11-18: 4 mg via INTRAVENOUS
  Filled 2017-11-18: qty 2

## 2017-11-18 MED ORDER — GI COCKTAIL ~~LOC~~
30.0000 mL | Freq: Once | ORAL | Status: AC
  Administered 2017-11-18: 30 mL via ORAL
  Filled 2017-11-18: qty 30

## 2017-11-18 MED ORDER — SODIUM CHLORIDE 0.9 % IV BOLUS
1000.0000 mL | Freq: Once | INTRAVENOUS | Status: AC
  Administered 2017-11-18: 1000 mL via INTRAVENOUS

## 2017-11-18 MED ORDER — HYDROMORPHONE HCL 1 MG/ML IJ SOLN
0.5000 mg | Freq: Once | INTRAMUSCULAR | Status: AC
  Administered 2017-11-18: 0.5 mg via INTRAVENOUS
  Filled 2017-11-18: qty 1

## 2017-11-18 NOTE — ED Triage Notes (Signed)
Pt reports that she has "acid reflux problems and has been vomiting everything she eats for 4 days as well as inability to drink" She c/o abdominal pain and chest burning  She has tried zantac without relief and tums.

## 2017-11-18 NOTE — ED Notes (Signed)
Alert, NAD, calm, interactive, resps e/u, speaking in clear complete sentences, no dyspnea noted, skin W&D, initial VSS, c/o acid reflux and epigastric burning, also NV, similar to past episodes, no relief with GI meds, multiple prescribed in the past, unsure of med names, (denies: "pain", sob, dizziness or visual changes). [facility] Reports some/little relief from previous zofran.

## 2017-11-18 NOTE — ED Provider Notes (Signed)
[facility] EMERGENCY DEPARTMENT Provider Note   CSN: [id number] Arrival date & time: 11/18/17  1817     History   Chief Complaint Chief Complaint  Patient presents with  . Gastroesophageal Reflux    HPI Victoria Smith is a 27 y.o. female with PMH/o GERD who presents for evaluation of 4 days of epigastric burning and nausea/vomiting. Patient reports that symptoms are consistent with episodes of GERD.  Patient reports she has been taking over-the-counter Nexium with minimal improvement in symptoms.  Patient reports that it will temporarily help with the burning but then the pain and nausea vomiting return.  Patient reports that she has had similar episodes of symptoms in the past.  Patient reports that she has had several episodes of nausea/vomiting.  Emesis is nonbloody.  Patient reports she is not able to tolerate any p.o. since onset of symptoms.  Patient denies any chest pain, difficulty breathing, hematuria, dysuria.  The history is provided by the patient.    Past Medical History:  Diagnosis Date  . GERD (gastroesophageal reflux disease)     There are no active problems to display for this patient.   Past Surgical History:  Procedure Laterality Date  . CESAREAN SECTION    . CHOLECYSTECTOMY    . TUBAL LIGATION       OB History    Gravida  3   Para  2   Term  1   Preterm  1   AB      Living  2     SAB      TAB      Ectopic      Multiple      Live Births               Home Medications    Prior to Admission medications   Medication Sig Start Date End Date Taking? Authorizing Provider  metoCLOPramide (REGLAN) 10 MG tablet Take 1 tablet (10 mg total) by mouth every 8 (eight) hours as needed for nausea or vomiting. Patient not taking: Reported on 09/22/2016 04/05/16   Sherwood Gambler, MD  omeprazole (PRILOSEC) 40 MG capsule Take 40 mg by mouth daily.    [provider]  ondansetron (ZOFRAN ODT) 4 MG disintegrating tablet Take 1  tablet (4 mg total) by mouth every 8 (eight) hours as needed for nausea or vomiting. 01/07/16   Barrett, Lahoma Crocker, PA-C  ondansetron (ZOFRAN) 4 MG tablet Take 1 tablet (4 mg total) by mouth every 6 (six) hours. 11/19/17   Volanda Napoleon, PA-C  oxycodone (OXY-IR) 5 MG capsule Take 5 mg by mouth every 4 (four) hours as needed for pain.    [provider]  pantoprazole (PROTONIX) 40 MG tablet Take 1 tablet (40 mg total) by mouth daily. Patient not taking: Reported on 09/22/2016 04/05/16   Sherwood Gambler, MD  potassium chloride SA (K-DUR,KLOR-CON) 20 MEQ tablet Take 1 tablet (20 mEq total) by mouth daily. 04/05/16   Sherwood Gambler, MD  Prenatal Vit-Fe Fumarate-FA (PRENATAL VITAMIN PO) Take by mouth.    [provider]  promethazine (PHENERGAN) 25 MG suppository Place 1 suppository (25 mg total) rectally every 6 (six) hours as needed for nausea or vomiting. Patient not taking: Reported on 09/22/2016 01/07/16   Barrett, Lahoma Crocker, PA-C  sucralfate (CARAFATE) 1 GM/10ML suspension Take 10 mLs (1 g total) by mouth 4 (four) times daily -  with meals and at bedtime. 11/19/17   Volanda Napoleon, PA-C    Family History  Family History  Problem Relation Age of Onset  . Cancer Mother   . Mental retardation Brother     Social History Social History   Tobacco Use  . Smoking status: Former Research scientist (life sciences)  . Smokeless tobacco: Never Used  Substance Use Topics  . Alcohol use: No  . Drug use: No     Allergies   Patient has no known allergies.   Review of Systems Review of Systems  Constitutional: Positive for appetite change and chills. Negative for fever.  Respiratory: Negative for cough and shortness of breath.   Cardiovascular: Negative for chest pain.  Gastrointestinal: Positive for abdominal pain, nausea and vomiting. Negative for blood in stool, constipation and diarrhea.  Genitourinary: Negative for dysuria and hematuria.  Musculoskeletal: Negative for back pain and neck pain.    Neurological: Negative for dizziness, weakness, numbness and headaches.  All other systems reviewed and are negative.    Physical Exam Updated Vital Signs BP 108/69   Pulse 63   Temp 99 F (37.2 C) (Oral)   Resp 18   Wt 104.3 kg (230 lb)   SpO2 97%   BMI 39.48 kg/m   Physical Exam  Constitutional: She is oriented to person, place, and time. She appears well-developed and well-nourished.  HENT:  Head: Normocephalic and atraumatic.  Mouth/Throat: Oropharynx is clear and moist and mucous membranes are normal.  Eyes: Pupils are equal, round, and reactive to light. Conjunctivae, EOM and lids are normal.  Neck: Full passive range of motion without pain.  Cardiovascular: Normal rate, regular rhythm, normal heart sounds and normal pulses. Exam reveals no gallop and no friction rub.  No murmur heard. Pulmonary/Chest: Effort normal and breath sounds normal.  No evidence of respiratory distress. Able to speak in full sentences without difficulty.  Abdominal: Soft. Normal appearance. There is tenderness in the epigastric area. There is no rigidity, no guarding and no CVA tenderness.  Abdomen is soft, nondistended.  Tenderness noted to the epigastric region.  Patient reports that burning sensation radiates up in the midsternal area.  Musculoskeletal: Normal range of motion.  Neurological: She is alert and oriented to person, place, and time.  Skin: Skin is warm and dry. Capillary refill takes less than 2 seconds.  Psychiatric: She has a normal mood and affect. Her speech is normal.  Nursing note and vitals reviewed.    ED Treatments / Results  Labs (all labs ordered are listed, but only abnormal results are displayed) Labs Reviewed  CBC WITH DIFFERENTIAL/PLATELET - Abnormal; Notable for the following components:      Result Value   WBC 12.4 (*)    Neutro Abs 9.0 (*)    All other components within normal limits  COMPREHENSIVE METABOLIC PANEL - Abnormal; Notable for the following  components:   Potassium 2.8 (*)    Total Protein 8.5 (*)    AST 45 (*)    ALT 100 (*)    All other components within normal limits  URINALYSIS, ROUTINE W REFLEX MICROSCOPIC - Abnormal; Notable for the following components:   Color, Urine AMBER (*)    APPearance CLOUDY (*)    Hgb urine dipstick TRACE (*)    Bilirubin Urine MODERATE (*)    Ketones, ur >80 (*)    Protein, ur 30 (*)    Leukocytes, UA TRACE (*)    All other components within normal limits  URINALYSIS, MICROSCOPIC (REFLEX) - Abnormal; Notable for the following components:   Bacteria, UA MANY (*)    Squamous Epithelial /  LPF TOO NUMEROUS TO COUNT (*)    All other components within normal limits  PREGNANCY, URINE  LIPASE, BLOOD    EKG None  Radiology No results found.  Procedures Procedures (including critical care time)  Medications Ordered in ED Medications  ondansetron (ZOFRAN-ODT) disintegrating tablet 4 mg (4 mg Oral Given 11/18/17 1841)  sodium chloride 0.9 % bolus 1,000 mL (0 mLs Intravenous Stopped 11/18/17 2222)  potassium chloride 10 mEq in 100 mL IVPB (0 mEq Intravenous Stopped 11/19/17 0024)  gi cocktail (Maalox,Lidocaine,Donnatal) (30 mLs Oral Given 11/18/17 2314)  ondansetron (ZOFRAN) injection 4 mg (4 mg Intravenous Given 11/18/17 2246)  metoCLOPramide (REGLAN) injection 10 mg (10 mg Intravenous Given 11/18/17 2320)  diphenhydrAMINE (BENADRYL) injection 25 mg (25 mg Intravenous Given 11/18/17 2317)  HYDROmorphone (DILAUDID) injection 0.5 mg (0.5 mg Intravenous Given 11/18/17 2318)     Initial Impression / Assessment and Plan / ED Course  I have reviewed the triage vital signs and the nursing notes.  Pertinent labs & imaging results that were available during my care of the patient were reviewed by me and considered in my medical decision making (see chart for details).     27 year old female who presents for evaluation of nausea/vomiting and epigastric pain.  States she has a history of GERD and  felt like symptoms were consistent with GERD.  Is taking over-the-counter GERD medication with no improvement.  Not able to tolerate p.o.  No fevers.  Patient with tenderness palpation of the epigastric region that radiates up into the midsternal area.  No complaints of chest pain.  She states that simply a burning sensation that is consistent with GERD. Patient is afebrile, non-toxic appearing, sitting comfortably on examination table. Vital signs reviewed and stable. IVF given for fluid resuscitation. Plan for GI cocktail.   Review of records show the patient has had similar symptoms previously.  She was seen here in August 2018 for evaluation of same symptoms.  Pregnancy negative.  Lipase unremarkable.  UA shows trace hemoglobin, small amount of ketones, leukocytes.  No nitrates noted.  Sample does appear contaminated as there are too numerous to count squamous epithelium.  CBC shows slight leukocytosis 12.4.  No obvious anemia.  CMP shows potassium of 2.8.  We will plan to replete here in the ED.  AST and ALT are slightly elevated at 45 and 100.  Alk phos is within normal limits.  Total bili is within normal limits.  Reevaluation.  Patient reports improvement in nausea and vomiting after medications.  She reports that after GI cocktail, pain is down to 4/10.  Patient reports she still having a burning sensation pain.  Review of patient records show that in previous visits, she has had to have Reglan, Dilaudid and Benadryl to help with her symptoms.  We will plan to give additional medications here in the ED.  Reevaluation after additional round of analgesics.  Patient reports improvement in pain.  Repeat abdominal exam shows no tenderness noted.  No rigidity, guarding.  Exam not concerning for appendicitis.  Vital signs stable.  We will plan to p.o. challenge patient department.  Patient able to tolerate p.o. without any difficulty here in the ED.  Repeat abdominal exam is benign. Not concerning for  appendicitis, perforation.  Will plan for discharge with outpatient referral to GI.  I discussed results with patient.  Encourage patient not to take any Tylenol until she has her liver function tests repeated.  We will plan to provide symptomatically for GERD.  Patient instructed to establish primary care doctor for further evaluation. Patient had ample opportunity for questions and discussion. All patient's questions were answered with full understanding. Strict return precautions discussed. Patient expresses understanding and agreement to plan.   Final Clinical Impressions(s) / ED Diagnoses   Final diagnoses:  Gastroesophageal reflux disease, esophagitis presence not specified  Transaminitis  Non-intractable vomiting with nausea, unspecified vomiting type    ED Discharge Orders        Ordered    ondansetron (ZOFRAN) 4 MG tablet  Every 6 hours     11/19/17 0111    sucralfate (CARAFATE) 1 GM/10ML suspension  3 times daily with meals & bedtime     11/19/17 0111       Volanda Napoleon, PA-C 11/19/17 1257    Quintella Reichert, MD 11/19/17 1452

## 2017-11-19 ENCOUNTER — Emergency Department (HOSPITAL_BASED_OUTPATIENT_CLINIC_OR_DEPARTMENT_OTHER): Payer: Self-pay

## 2017-11-19 LAB — URINALYSIS, ROUTINE W REFLEX MICROSCOPIC
Glucose, UA: NEGATIVE mg/dL
NITRITE: NEGATIVE
PROTEIN: 30 mg/dL — AB
Specific Gravity, Urine: 1.015 (ref 1.005–1.030)
pH: 7 (ref 5.0–8.0)

## 2017-11-19 LAB — URINALYSIS, MICROSCOPIC (REFLEX)

## 2017-11-19 MED ORDER — SUCRALFATE 1 GM/10ML PO SUSP
1.0000 g | Freq: Three times a day (TID) | ORAL | 0 refills | Status: DC
Start: 2017-11-19 — End: 2018-10-13

## 2017-11-19 MED ORDER — ONDANSETRON HCL 4 MG PO TABS: 4.0000 mg | ORAL_TABLET | Freq: Four times a day (QID) | ORAL | 0 refills | Status: DC

## 2017-11-19 NOTE — Discharge Instructions (Signed)
Take your Nexium as prescribed.  He can also take Carafate to help with symptoms.  Take Zofran as needed for nausea/vomiting.  As we discussed, you need to follow-up with a GI doctor.  I provided GI referral for you to follow-up with Drinda ButtsAnnette on an outpatient basis.  As we discussed, your liver enzymes were slightly elevated here in the ED today.  Do not take any Tylenol.  He will need to have these rechecked by either of the GI doctor or primary care doctor.  He can follow-up with [facility] for [facility] care establishment.  Return the emergency department for any fever, abdominal pain, worsening nausea or vomiting, blood in vomit, chest pain, difficulty breathing or any other worsening or concerning symptoms.

## 2017-11-19 NOTE — ED Notes (Signed)
EDPA into see 

## 2017-11-19 NOTE — ED Notes (Signed)
Given ginger ale (PO challenge). Alert, NAD, calm, denies sx, "feel better".

## 2018-08-20 ENCOUNTER — Emergency Department (HOSPITAL_BASED_OUTPATIENT_CLINIC_OR_DEPARTMENT_OTHER)
Admission: EM | Admit: 2018-08-20 | Discharge: 2018-08-20 | Disposition: A | Discharge status: Home / Self Care | Payer: Self-pay | Attending: Emergency Medicine | Admitting: Emergency Medicine

## 2018-08-20 ENCOUNTER — Encounter (HOSPITAL_BASED_OUTPATIENT_CLINIC_OR_DEPARTMENT_OTHER): Payer: Self-pay | Admitting: Emergency Medicine

## 2018-08-20 ENCOUNTER — Other Ambulatory Visit: Payer: Self-pay

## 2018-08-20 ENCOUNTER — Emergency Department (HOSPITAL_BASED_OUTPATIENT_CLINIC_OR_DEPARTMENT_OTHER): Payer: Self-pay

## 2018-08-20 DIAGNOSIS — R112 Nausea with vomiting, unspecified: Secondary | ICD-10-CM | POA: Insufficient documentation

## 2018-08-20 DIAGNOSIS — R05 Cough: Secondary | ICD-10-CM | POA: Insufficient documentation

## 2018-08-20 DIAGNOSIS — Z3202 Encounter for pregnancy test, result negative: Secondary | ICD-10-CM | POA: Insufficient documentation

## 2018-08-20 DIAGNOSIS — Z87891 Personal history of nicotine dependence: Secondary | ICD-10-CM | POA: Insufficient documentation

## 2018-08-20 DIAGNOSIS — Z79899 Other long term (current) drug therapy: Secondary | ICD-10-CM | POA: Insufficient documentation

## 2018-08-20 DIAGNOSIS — R059 Cough, unspecified: Secondary | ICD-10-CM

## 2018-08-20 DIAGNOSIS — R1013 Epigastric pain: Secondary | ICD-10-CM | POA: Insufficient documentation

## 2018-08-20 LAB — URINALYSIS, ROUTINE W REFLEX MICROSCOPIC
Bilirubin Urine: NEGATIVE
GLUCOSE, UA: NEGATIVE mg/dL
HGB URINE DIPSTICK: NEGATIVE
KETONES UR: NEGATIVE mg/dL
LEUKOCYTES UA: NEGATIVE
Nitrite: NEGATIVE
PROTEIN: NEGATIVE mg/dL
Specific Gravity, Urine: 1.01 (ref 1.005–1.030)
pH: 8.5 — ABNORMAL HIGH (ref 5.0–8.0)

## 2018-08-20 LAB — LIPASE, BLOOD: LIPASE: 62 U/L — AB (ref 11–51)

## 2018-08-20 LAB — CBC WITH DIFFERENTIAL/PLATELET
Abs Immature Granulocytes: 0.04 10*3/uL (ref 0.00–0.07)
BASOS PCT: 0 %
Basophils Absolute: 0 10*3/uL (ref 0.0–0.1)
EOS ABS: 0 10*3/uL (ref 0.0–0.5)
EOS PCT: 0 %
HCT: 38.8 % (ref 36.0–46.0)
Hemoglobin: 12.6 g/dL (ref 12.0–15.0)
IMMATURE GRANULOCYTES: 0 %
LYMPHS ABS: 1.5 10*3/uL (ref 0.7–4.0)
Lymphocytes Relative: 12 %
MCH: 28.4 pg (ref 26.0–34.0)
MCHC: 32.5 g/dL (ref 30.0–36.0)
MCV: 87.4 fL (ref 80.0–100.0)
MONO ABS: 0.5 10*3/uL (ref 0.1–1.0)
MONOS PCT: 4 %
Neutro Abs: 11 10*3/uL — ABNORMAL HIGH (ref 1.7–7.7)
Neutrophils Relative %: 84 %
PLATELETS: 298 10*3/uL (ref 150–400)
RBC: 4.44 MIL/uL (ref 3.87–5.11)
RDW: 12.3 % (ref 11.5–15.5)
WBC: 13.1 10*3/uL — ABNORMAL HIGH (ref 4.0–10.5)
nRBC: 0 % (ref 0.0–0.2)

## 2018-08-20 LAB — COMPREHENSIVE METABOLIC PANEL WITH GFR
ALT: 25 U/L (ref 0–44)
AST: 20 U/L (ref 15–41)
Albumin: 4.1 g/dL (ref 3.5–5.0)
Alkaline Phosphatase: 61 U/L (ref 38–126)
Anion gap: 9 (ref 5–15)
BUN: 7 mg/dL (ref 6–20)
CO2: 24 mmol/L (ref 22–32)
Calcium: 9.4 mg/dL (ref 8.9–10.3)
Chloride: 104 mmol/L (ref 98–111)
Creatinine, Ser: 0.66 mg/dL (ref 0.44–1.00)
GFR calc Af Amer: >60 mL/min
GFR calc non Af Amer: >60 mL/min
Glucose, Bld: 107 mg/dL — ABNORMAL HIGH (ref 70–99)
Potassium: 3.9 mmol/L (ref 3.5–5.1)
Sodium: 137 mmol/L (ref 135–145)
Total Bilirubin: 0.5 mg/dL (ref 0.3–1.2)
Total Protein: 7.9 g/dL (ref 6.5–8.1)

## 2018-08-20 LAB — PREGNANCY, URINE: Preg Test, Ur: NEGATIVE

## 2018-08-20 MED ORDER — SODIUM CHLORIDE 0.9 % IV BOLUS
1000.0000 mL | Freq: Once | INTRAVENOUS | Status: AC
  Administered 2018-08-20: 1000 mL via INTRAVENOUS

## 2018-08-20 MED ORDER — ONDANSETRON 4 MG PO TBDP: 4.0000 mg | ORAL_TABLET | Freq: Three times a day (TID) | ORAL | 0 refills | Status: DC | PRN

## 2018-08-20 MED ORDER — FENTANYL CITRATE (PF) 100 MCG/2ML IJ SOLN
50.0000 ug | Freq: Once | INTRAMUSCULAR | Status: AC
  Administered 2018-08-20: 50 ug via INTRAVENOUS
  Filled 2018-08-20: qty 2

## 2018-08-20 MED ORDER — LIDOCAINE VISCOUS HCL 2 % MT SOLN
15.0000 mL | Freq: Once | OROMUCOSAL | Status: AC
  Administered 2018-08-20: 15 mL via ORAL
  Filled 2018-08-20: qty 15

## 2018-08-20 MED ORDER — ALUM & MAG HYDROXIDE-SIMETH 200-200-20 MG/5ML PO SUSP
30.0000 mL | Freq: Once | ORAL | Status: AC
  Administered 2018-08-20: 30 mL via ORAL
  Filled 2018-08-20: qty 30

## 2018-08-20 MED ORDER — ONDANSETRON HCL 4 MG/2ML IJ SOLN
4.0000 mg | Freq: Once | INTRAMUSCULAR | Status: AC
  Administered 2018-08-20: 4 mg via INTRAVENOUS
  Filled 2018-08-20: qty 2

## 2018-08-20 MED FILL — ONDANSETRON ODT 4 MG TABLET: 4 | 4 days supply | Qty: 10 | Fill #0

## 2018-08-20 NOTE — Discharge Instructions (Signed)
Your work-up today was overall reassuring.  Given the similarity to prior symptoms with your reflux, suspect similar cause.  Please take your home reflux medications and add the nausea medication we provided to maintain hydration.  Please follow-up with your PCP and your GI team.  If any symptoms change or worsen, please return to the nearest emergency department.

## 2018-08-20 NOTE — ED Provider Notes (Signed)
[facility] EMERGENCY DEPARTMENT Provider Note   CSN: [id number] Arrival date & time: 08/20/18  0754     History   Chief Complaint Chief Complaint  Patient presents with  . Emesis    HPI Victoria Smith is a 28 y.o. female.  The history is provided by the patient and medical records. No language interpreter was used.  Emesis  Severity:  Moderate Duration:  1 week (1.5) Timing:  Constant Quality:  Unable to specify Able to tolerate:  Liquids Progression:  Worsening Chronicity:  Recurrent Recent urination:  Normal Relieved by:  Nothing Worsened by:  Nothing Ineffective treatments:  None tried Associated symptoms: abdominal pain and cough   Associated symptoms: no chills, no diarrhea, no fever, no headaches and no sore throat     Past Medical History:  Diagnosis Date  . GERD (gastroesophageal reflux disease)     There are no active problems to display for this patient.   Past Surgical History:  Procedure Laterality Date  . CESAREAN SECTION    . CHOLECYSTECTOMY    . TUBAL LIGATION       OB History    Gravida  3   Para  2   Term  1   Preterm  1   AB      Living  2     SAB      TAB      Ectopic      Multiple      Live Births               Home Medications    Prior to Admission medications   Medication Sig Start Date End Date Taking? Authorizing Provider  metoCLOPramide (REGLAN) 10 MG tablet Take 1 tablet (10 mg total) by mouth every 8 (eight) hours as needed for nausea or vomiting. Patient not taking: Reported on 09/22/2016 04/05/16   Pricilla Loveless, MD  omeprazole (PRILOSEC) 40 MG capsule Take 40 mg by mouth daily.    [provider]  ondansetron (ZOFRAN ODT) 4 MG disintegrating tablet Take 1 tablet (4 mg total) by mouth every 8 (eight) hours as needed for nausea or vomiting. 01/07/16   Barrett, Rolm Gala, PA-C  ondansetron (ZOFRAN) 4 MG tablet Take 1 tablet (4 mg total) by mouth every 6 (six) hours. 11/19/17   Maxwell Caul, PA-C  oxycodone (OXY-IR) 5 MG capsule Take 5 mg by mouth every 4 (four) hours as needed for pain.    [provider]  pantoprazole (PROTONIX) 40 MG tablet Take 1 tablet (40 mg total) by mouth daily. Patient not taking: Reported on 09/22/2016 04/05/16   Pricilla Loveless, MD  potassium chloride SA (K-DUR,KLOR-CON) 20 MEQ tablet Take 1 tablet (20 mEq total) by mouth daily. 04/05/16   Pricilla Loveless, MD  Prenatal Vit-Fe Fumarate-FA (PRENATAL VITAMIN PO) Take by mouth.    [provider]  promethazine (PHENERGAN) 25 MG suppository Place 1 suppository (25 mg total) rectally every 6 (six) hours as needed for nausea or vomiting. Patient not taking: Reported on 09/22/2016 01/07/16   Barrett, Rolm Gala, PA-C  sucralfate (CARAFATE) 1 GM/10ML suspension Take 10 mLs (1 g total) by mouth 4 (four) times daily -  with meals and at bedtime. 11/19/17   Maxwell Caul, PA-C    Family History Family History  Problem Relation Age of Onset  . Cancer Mother   . Mental retardation Brother     Social History Social History   Tobacco Use  . Smoking status: Former  Smoker  . Smokeless tobacco: Never Used  Substance Use Topics  . Alcohol use: No  . Drug use: Yes    Types: Marijuana     Allergies   Patient has no known allergies.   Review of Systems Review of Systems  Constitutional: Negative for chills, diaphoresis, fatigue and fever.  HENT: Negative for congestion and sore throat.   Eyes: Negative for visual disturbance.  Respiratory: Positive for cough. Negative for chest tightness, shortness of breath, wheezing and stridor.   Cardiovascular: Negative for chest pain, palpitations and leg swelling.  Gastrointestinal: Positive for abdominal pain, constipation, nausea and vomiting. Negative for diarrhea.  Genitourinary: Negative for dysuria and flank pain.  Musculoskeletal: Negative for back pain, neck pain and neck stiffness.  Skin: Negative for rash and wound.  Neurological:  Negative for dizziness, light-headedness and headaches.  Psychiatric/Behavioral: Negative for agitation.  All other systems reviewed and are negative.    Physical Exam Updated Vital Signs BP (!) 153/104 (BP Location: Left Arm)   Pulse (!) 103   Temp 98.7 F (37.1 C) (Oral)   Resp (!) 24   Ht 5\' 4"  (1.626 m)   Wt 97.5 kg   LMP 08/13/2018   SpO2 99%   BMI 36.90 kg/m   Physical Exam Vitals signs and nursing note reviewed.  Constitutional:      General: She is not in acute distress.    Appearance: Normal appearance. She is not ill-appearing, toxic-appearing or diaphoretic.  HENT:     Head: Normocephalic.     Nose: No congestion.     Mouth/Throat:     Pharynx: No oropharyngeal exudate or posterior oropharyngeal erythema.  Eyes:     Conjunctiva/sclera: Conjunctivae normal.     Pupils: Pupils are equal, round, and reactive to light.  Neck:     Musculoskeletal: Normal range of motion. No muscular tenderness.  Cardiovascular:     Rate and Rhythm: Regular rhythm. Tachycardia present.     Pulses: Normal pulses.     Heart sounds: No murmur.  Pulmonary:     Effort: Pulmonary effort is normal. No respiratory distress.     Breath sounds: No stridor. No wheezing, rhonchi or rales.  Chest:     Chest wall: No tenderness.  Abdominal:     General: Bowel sounds are normal.     Tenderness: There is abdominal tenderness in the epigastric area. There is no right CVA tenderness, left CVA tenderness, guarding or rebound.    Musculoskeletal:        General: No tenderness.     Right lower leg: No edema.     Left lower leg: No edema.  Skin:    Capillary Refill: Capillary refill takes less than 2 seconds.     Coloration: Skin is not pale.     Findings: No erythema.  Neurological:     General: No focal deficit present.     Mental Status: She is alert and oriented to person, place, and time.  Psychiatric:        Mood and Affect: Mood normal.      ED Treatments / Results  Labs (all  labs ordered are listed, but only abnormal results are displayed) Labs Reviewed  CBC WITH DIFFERENTIAL/PLATELET - Abnormal; Notable for the following components:      Result Value   WBC 13.1 (*)    Neutro Abs 11.0 (*)    All other components within normal limits  COMPREHENSIVE METABOLIC PANEL - Abnormal; Notable for the following components:  Glucose, Bld 107 (*)    All other components within normal limits  LIPASE, BLOOD - Abnormal; Notable for the following components:   Lipase 62 (*)    All other components within normal limits  URINALYSIS, ROUTINE W REFLEX MICROSCOPIC - Abnormal; Notable for the following components:   pH 8.5 (*)    All other components within normal limits  URINE CULTURE  PREGNANCY, URINE    EKG None  Radiology Dg Chest 2 View  Result Date: 08/20/2018 CLINICAL DATA:  Cough and pain EXAM: CHEST - 2 VIEW COMPARISON:  April 05, 2016 FINDINGS: Lungs are clear. Heart size and pulmonary vascularity are normal. No adenopathy. No pneumothorax. No bone lesions. IMPRESSION: No edema or consolidation. Electronically Signed   By: Bretta BangWilliam  Woodruff III M.D.   On: 08/20/2018 08:44    Procedures Procedures (including critical care time)  Medications Ordered in ED Medications  sodium chloride 0.9 % bolus 1,000 mL (0 mLs Intravenous Stopped 08/20/18 0926)  ondansetron (ZOFRAN) injection 4 mg (4 mg Intravenous Given 08/20/18 0825)  fentaNYL (SUBLIMAZE) injection 50 mcg (50 mcg Intravenous Given 08/20/18 0825)  alum & mag hydroxide-simeth (MAALOX/MYLANTA) 200-200-20 MG/5ML suspension 30 mL (30 mLs Oral Given 08/20/18 0826)    And  lidocaine (XYLOCAINE) 2 % viscous mouth solution 15 mL (15 mLs Oral Given 08/20/18 [id number])     Initial Impression / Assessment and Plan / ED Course  I have reviewed the triage vital signs and the nursing notes.  Pertinent labs & imaging results that were available during my care of the patient were reviewed by me and considered in my medical  decision making (see chart for details).     Victoria Smith is a 28 y.o. female with a past medical history significant for prior cholecystectomy, and significant GERD who presents with nausea, vomiting, and abdominal discomfort.  Patient reports that her symptoms feel like when she has had an exacerbation of gastric reflux in the past.  She reports that for the last week and a half she has been having upper abdominal discomfort with burning and sharpness.  She reports that she has been taking omeprazole and over-the-counter medications as directed without significant relief.  She reports has had some coughing but denies chest pain or shortness of breath.  She reports the pain is primarily in her epigastrium and feels similar to prior.  She denies any recent trauma.  She denies any urinary symptoms.  She denies any new rectal bleeding or diarrhea.  She does report occasional constipation.  She reports the pain gets up to a 9 out of 10 in severity with nausea and vomiting.  She is reports she has had decreased oral intake due to the symptoms.  On exam, patient is some epigastric tenderness.  Lower abdomen is nontender.  Back is nontender.  Lungs clear and no murmur appreciated.  Chest is nontender.  Symmetric pulses.  Patient reports this feels very similar to last episode where she was given pain medicine, nausea medicine, fluids, have screening labs, and felt better and was able to go home.  Patient has already had her gallbladder removed.  Will hold on imaging initially.  We will try and repeat her prior visit with the screening labs.  We will get a chest x-ray as she does report she is had some cough recently.  Anticipate reassessment after medications and fluids.  Anticipate discharge home if she is feeling better.  11:25 AM Laboratory testing was also ordered prior and reassuring.  Mild  leukocytosis and elevated lipase likely related to her nausea and vomiting.  Doubt full-blown pancreatitis at this  time.  Patient was feeling much better after fluids and nausea medicine.  Patient feels safe for going home.  Patient will continue taking her reflux medications and will use Zofran ODT to help stay hydrated.  Please will follow-up with her PCP and her gastroenterology team for further management.  Patient had no other questions or concerns and understand return precautions.  Patient discharged in good condition with improved symptoms.  Final Clinical Impressions(s) / ED Diagnoses   Final diagnoses:  Epigastric pain  Non-intractable vomiting with nausea, unspecified vomiting type  Cough    ED Discharge Orders         Ordered    ondansetron (ZOFRAN ODT) 4 MG disintegrating tablet  Every 8 hours PRN     08/20/18 1126          Clinical Impression: 1. Epigastric pain   2. Non-intractable vomiting with nausea, unspecified vomiting type   3. Cough     Disposition: Discharge  Condition: Good  I have discussed the results, Dx and Tx plan with the pt(& family if present). He/she/they expressed understanding and agree(s) with the plan. Discharge instructions discussed at great length. Strict return precautions discussed and pt &/or family have verbalized understanding of the instructions. No further questions at time of discharge.    New Prescriptions   ONDANSETRON (ZOFRAN ODT) 4 MG DISINTEGRATING TABLET    Take 1 tablet (4 mg total) by mouth every 8 (eight) hours as needed for nausea or vomiting.    Follow Up: [facility] [address] [place] [address] [phone] Schedule an appointment as soon as possible for a visit    Your Gastroenterologist     [facility] [place] [facility] [address] [id number] [address] [place] [address] [phone]       Arrin Pintor, Canary Brim, MD 08/20/18 1601

## 2018-08-20 NOTE — ED Notes (Signed)
Patient transported to X-ray 

## 2018-08-20 NOTE — ED Triage Notes (Signed)
Pt reports her acid reflux has been bad for over a week. Taking omeprazole without relief. Endorses burning to chest and abd and vomiting.

## 2018-08-20 NOTE — ED Notes (Signed)
ED Provider at bedside. 

## 2018-08-21 LAB — URINE CULTURE

## 2018-10-13 ENCOUNTER — Emergency Department (HOSPITAL_BASED_OUTPATIENT_CLINIC_OR_DEPARTMENT_OTHER)
Admission: EM | Admit: 2018-10-13 | Discharge: 2018-10-13 | Disposition: A | Discharge status: Home / Self Care | Payer: Self-pay | Attending: Emergency Medicine | Admitting: Emergency Medicine

## 2018-10-13 ENCOUNTER — Encounter (HOSPITAL_BASED_OUTPATIENT_CLINIC_OR_DEPARTMENT_OTHER): Payer: Self-pay | Admitting: Emergency Medicine

## 2018-10-13 ENCOUNTER — Other Ambulatory Visit: Payer: Self-pay

## 2018-10-13 DIAGNOSIS — R112 Nausea with vomiting, unspecified: Secondary | ICD-10-CM | POA: Insufficient documentation

## 2018-10-13 DIAGNOSIS — Z9049 Acquired absence of other specified parts of digestive tract: Secondary | ICD-10-CM | POA: Insufficient documentation

## 2018-10-13 DIAGNOSIS — Z79899 Other long term (current) drug therapy: Secondary | ICD-10-CM | POA: Insufficient documentation

## 2018-10-13 DIAGNOSIS — R1013 Epigastric pain: Secondary | ICD-10-CM | POA: Insufficient documentation

## 2018-10-13 DIAGNOSIS — Z87891 Personal history of nicotine dependence: Secondary | ICD-10-CM | POA: Insufficient documentation

## 2018-10-13 LAB — CBC WITH DIFFERENTIAL/PLATELET
Abs Immature Granulocytes: 0.02 10*3/uL (ref 0.00–0.07)
Basophils Absolute: 0 10*3/uL (ref 0.0–0.1)
Basophils Relative: 0 %
Eosinophils Absolute: 0 10*3/uL (ref 0.0–0.5)
Eosinophils Relative: 0 %
HCT: 43.4 % (ref 36.0–46.0)
Hemoglobin: 14.4 g/dL (ref 12.0–15.0)
Immature Granulocytes: 0 %
LYMPHS PCT: 26 %
Lymphs Abs: 2.3 10*3/uL (ref 0.7–4.0)
MCH: 29 pg (ref 26.0–34.0)
MCHC: 33.2 g/dL (ref 30.0–36.0)
MCV: 87.5 fL (ref 80.0–100.0)
Monocytes Absolute: 0.7 10*3/uL (ref 0.1–1.0)
Monocytes Relative: 8 %
Neutro Abs: 5.7 10*3/uL (ref 1.7–7.7)
Neutrophils Relative %: 66 %
Platelets: 344 10*3/uL (ref 150–400)
RBC: 4.96 MIL/uL (ref 3.87–5.11)
RDW: 13.4 % (ref 11.5–15.5)
WBC: 8.8 10*3/uL (ref 4.0–10.5)
nRBC: 0 % (ref 0.0–0.2)

## 2018-10-13 LAB — URINALYSIS, ROUTINE W REFLEX MICROSCOPIC
Glucose, UA: NEGATIVE mg/dL
Ketones, ur: 40 mg/dL — AB
Nitrite: NEGATIVE
Protein, ur: NEGATIVE mg/dL
SPECIFIC GRAVITY, URINE: 1.02 (ref 1.005–1.030)
pH: 6.5 (ref 5.0–8.0)

## 2018-10-13 LAB — COMPREHENSIVE METABOLIC PANEL
ALT: 43 U/L (ref 0–44)
AST: 38 U/L (ref 15–41)
Albumin: 4.4 g/dL (ref 3.5–5.0)
Alkaline Phosphatase: 70 U/L (ref 38–126)
Anion gap: 11 (ref 5–15)
BILIRUBIN TOTAL: 0.8 mg/dL (ref 0.3–1.2)
BUN: 12 mg/dL (ref 6–20)
CALCIUM: 9.3 mg/dL (ref 8.9–10.3)
CO2: 23 mmol/L (ref 22–32)
Chloride: 100 mmol/L (ref 98–111)
Creatinine, Ser: 0.8 mg/dL (ref 0.44–1.00)
Glucose, Bld: 98 mg/dL (ref 70–99)
Potassium: 3.1 mmol/L — ABNORMAL LOW (ref 3.5–5.1)
Sodium: 134 mmol/L — ABNORMAL LOW (ref 135–145)
TOTAL PROTEIN: 8.8 g/dL — AB (ref 6.5–8.1)

## 2018-10-13 LAB — URINALYSIS, MICROSCOPIC (REFLEX)

## 2018-10-13 LAB — LIPASE, BLOOD: Lipase: 37 U/L (ref 11–51)

## 2018-10-13 LAB — PREGNANCY, URINE: PREG TEST UR: NEGATIVE

## 2018-10-13 MED ORDER — ONDANSETRON 4 MG PO TBDP: 4.0000 mg | ORAL_TABLET | Freq: Three times a day (TID) | ORAL | 0 refills | Status: DC | PRN

## 2018-10-13 MED ORDER — SUCRALFATE 1 GM/10ML PO SUSP: 1.0000 g | Freq: Three times a day (TID) | ORAL | 0 refills | Status: DC

## 2018-10-13 MED ORDER — SODIUM CHLORIDE 0.9 % IV BOLUS
1000.0000 mL | Freq: Once | INTRAVENOUS | Status: AC
  Administered 2018-10-13: 1000 mL via INTRAVENOUS

## 2018-10-13 MED ORDER — POTASSIUM CHLORIDE CRYS ER 20 MEQ PO TBCR
40.0000 meq | EXTENDED_RELEASE_TABLET | Freq: Once | ORAL | Status: AC
  Administered 2018-10-13: 40 meq via ORAL
  Filled 2018-10-13: qty 2

## 2018-10-13 MED ORDER — PANTOPRAZOLE SODIUM 40 MG PO TBEC: 40.0000 mg | DELAYED_RELEASE_TABLET | Freq: Every day | ORAL | 0 refills | Status: DC

## 2018-10-13 MED ORDER — ONDANSETRON HCL 4 MG/2ML IJ SOLN
4.0000 mg | Freq: Once | INTRAMUSCULAR | Status: AC
  Administered 2018-10-13: 4 mg via INTRAVENOUS
  Filled 2018-10-13: qty 2

## 2018-10-13 MED ORDER — LIDOCAINE VISCOUS HCL 2 % MT SOLN
15.0000 mL | Freq: Once | OROMUCOSAL | Status: AC
  Administered 2018-10-13: 15 mL via ORAL
  Filled 2018-10-13: qty 15

## 2018-10-13 MED ORDER — ALUM & MAG HYDROXIDE-SIMETH 200-200-20 MG/5ML PO SUSP
30.0000 mL | Freq: Once | ORAL | Status: AC
  Administered 2018-10-13: 30 mL via ORAL
  Filled 2018-10-13: qty 30

## 2018-10-13 NOTE — ED Notes (Signed)
Pt tolerating PO fluids at this time.

## 2018-10-13 NOTE — Discharge Instructions (Signed)
Zofran for nausea.  Take Protonix and Carafate for GERD.  Follow-up with your GI doctor.  Return emergency department for any fever, vomiting, difficulty breathing or any other worsening or concerning symptoms.

## 2018-10-13 NOTE — ED Provider Notes (Signed)
[facility] EMERGENCY DEPARTMENT Provider Note   CSN: [id number] Arrival date & time: 10/13/18  1122    History   Chief Complaint Chief Complaint  Patient presents with  . Emesis    HPI Dejon Kassebaum is a 28 y.o. female has mixture of GERD who presents for evaluation of epigastric abdominal pain, nausea/vomiting x 3 days.  Patient reports that her family recently had GI bug.  She states that her children, husband were sick with similar symptoms.  Patient states that she started developing symptoms and states that is exacerbated her GERD.  Patient states she has epigastric pain with a burning sensation that comes up to the midsternal chest.  Patient states that over the last 2 days, she has not been able to tolerate any p.o.  She has had multiple episodes of nausea/vomiting.  She denies any bilious emesis but does report she has had some strings of blood and a few episodes of vomiting.  No pure hematemesis.  Patient states that she has not been able to take her over-the-counter acid reflux medications because of nausea/vomiting.  Patient states she has not noted any fever.  Patient states that this feels similar to her previous episodes of reflux.  Patient states that the burning sensation travels up into her midsternal chest but otherwise denies any pain.  Patient denies any difficulty breathing, dysuria, hematuria, diarrhea.     The history is provided by the patient.    Past Medical History:  Diagnosis Date  . GERD (gastroesophageal reflux disease)     There are no active problems to display for this patient.   Past Surgical History:  Procedure Laterality Date  . CESAREAN SECTION    . CHOLECYSTECTOMY    . TUBAL LIGATION       OB History    Gravida  3   Para  2   Term  1   Preterm  1   AB      Living  2     SAB      TAB      Ectopic      Multiple      Live Births               Home Medications    Prior to Admission medications     Medication Sig Start Date End Date Taking? Authorizing Provider  omeprazole (PRILOSEC) 40 MG capsule Take 40 mg by mouth daily.   Yes [provider]  ondansetron (ZOFRAN) 4 MG tablet Take 1 tablet (4 mg total) by mouth every 6 (six) hours. 11/19/17  Yes Maxwell Caul, PA-C  metoCLOPramide (REGLAN) 10 MG tablet Take 1 tablet (10 mg total) by mouth every 8 (eight) hours as needed for nausea or vomiting. Patient not taking: Reported on 09/22/2016 04/05/16   Pricilla Loveless, MD  ondansetron (ZOFRAN ODT) 4 MG disintegrating tablet Take 1 tablet (4 mg total) by mouth every 8 (eight) hours as needed for nausea or vomiting. 10/13/18   Maxwell Caul, PA-C  oxycodone (OXY-IR) 5 MG capsule Take 5 mg by mouth every 4 (four) hours as needed for pain.    [provider]  pantoprazole (PROTONIX) 40 MG tablet Take 1 tablet (40 mg total) by mouth daily. 10/13/18   Maxwell Caul, PA-C  potassium chloride SA (K-DUR,KLOR-CON) 20 MEQ tablet Take 1 tablet (20 mEq total) by mouth daily. 04/05/16   Pricilla Loveless, MD  Prenatal Vit-Fe Fumarate-FA (PRENATAL VITAMIN PO) Take by mouth.  [provider]  promethazine (PHENERGAN) 25 MG suppository Place 1 suppository (25 mg total) rectally every 6 (six) hours as needed for nausea or vomiting. Patient not taking: Reported on 09/22/2016 01/07/16   Barrett, Rolm GalaStevi, PA-C  sucralfate (CARAFATE) 1 GM/10ML suspension Take 10 mLs (1 g total) by mouth 4 (four) times daily -  with meals and at bedtime. 10/13/18   Maxwell CaulLayden, Lindsey A, PA-C    Family History Family History  Problem Relation Age of Onset  . Cancer Mother   . Mental retardation Brother     Social History Social History   Tobacco Use  . Smoking status: Former Games developermoker  . Smokeless tobacco: Never Used  Substance Use Topics  . Alcohol use: No  . Drug use: Yes    Types: Marijuana     Allergies   Patient has no known allergies.   Review of Systems Review of Systems   Constitutional: Negative for fever.  Respiratory: Negative for cough and shortness of breath.   Cardiovascular: Negative for chest pain.  Gastrointestinal: Positive for abdominal pain, nausea and vomiting.  Genitourinary: Negative for dysuria and hematuria.  Neurological: Negative for headaches.  All other systems reviewed and are negative.    Physical Exam Updated Vital Signs BP 128/89 (BP Location: Right Arm)   Pulse 84   Temp 98.5 F (36.9 C) (Oral)   Resp 18   Ht 5\' 4"  (1.626 m)   Wt 97.5 kg   LMP 09/14/2018   SpO2 100%   BMI 36.90 kg/m   Physical Exam Vitals signs and nursing note reviewed.  Constitutional:      Appearance: Normal appearance. She is well-developed.     Comments: Appears uncomfortable but NAD  HENT:     Head: Normocephalic and atraumatic.  Eyes:     General: Lids are normal.     Conjunctiva/sclera: Conjunctivae normal.     Pupils: Pupils are equal, round, and reactive to light.  Neck:     Musculoskeletal: Full passive range of motion without pain.  Cardiovascular:     Rate and Rhythm: Normal rate and regular rhythm.     Pulses: Normal pulses.     Heart sounds: Normal heart sounds. No murmur. No friction rub. No gallop.   Pulmonary:     Effort: Pulmonary effort is normal.     Breath sounds: Normal breath sounds.     Comments: Lungs clear to auscultation bilaterally.  Symmetric chest rise.  No wheezing, rales, rhonchi. Abdominal:     Palpations: Abdomen is soft. Abdomen is not rigid.     Tenderness: There is abdominal tenderness in the epigastric area. There is no right CVA tenderness, left CVA tenderness or guarding. Negative signs include McBurney's sign.     Comments: Abdomen is soft, nondistended.  Tenderness noted to the epigastric region.  No CVA tenderness bilaterally.  Musculoskeletal: Normal range of motion.  Skin:    General: Skin is warm and dry.     Capillary Refill: Capillary refill takes less than 2 seconds.  Neurological:      Mental Status: She is alert and oriented to person, place, and time.  Psychiatric:        Speech: Speech normal.      ED Treatments / Results  Labs (all labs ordered are listed, but only abnormal results are displayed) Labs Reviewed  COMPREHENSIVE METABOLIC PANEL - Abnormal; Notable for the following components:      Result Value   Sodium 134 (*)    Potassium  3.1 (*)    Total Protein 8.8 (*)    All other components within normal limits  URINALYSIS, ROUTINE W REFLEX MICROSCOPIC - Abnormal; Notable for the following components:   APPearance CLOUDY (*)    Hgb urine dipstick SMALL (*)    Bilirubin Urine SMALL (*)    Ketones, ur 40 (*)    Leukocytes,Ua TRACE (*)    All other components within normal limits  URINALYSIS, MICROSCOPIC (REFLEX) - Abnormal; Notable for the following components:   Bacteria, UA FEW (*)    All other components within normal limits  LIPASE, BLOOD  CBC WITH DIFFERENTIAL/PLATELET  PREGNANCY, URINE    EKG None  Radiology No results found.  Procedures Procedures (including critical care time)  Medications Ordered in ED Medications  sodium chloride 0.9 % bolus 1,000 mL (0 mLs Intravenous Stopped 10/13/18 1315)  ondansetron (ZOFRAN) injection 4 mg (4 mg Intravenous Given 10/13/18 1202)  alum & mag hydroxide-simeth (MAALOX/MYLANTA) 200-200-20 MG/5ML suspension 30 mL (30 mLs Oral Given 10/13/18 1155)    And  lidocaine (XYLOCAINE) 2 % viscous mouth solution 15 mL (15 mLs Oral Given 10/13/18 1155)  potassium chloride SA (K-DUR,KLOR-CON) CR tablet 40 mEq (40 mEq Oral Given 10/13/18 1417)     Initial Impression / Assessment and Plan / ED Course  I have reviewed the triage vital signs and the nursing notes.  Pertinent labs & imaging results that were available during my care of the patient were reviewed by me and considered in my medical decision making (see chart for details).        28 year old female with past medical history of GERD who presents for  evaluation of abdominal pain, nausea/vomiting. Reports recent GI bug with family.  Has not been able to tolerate p.o. in the last 2 days.  No fevers, diarrhea. Patient is afebrile, non-toxic appearing, sitting comfortably on examination table. Vital signs reviewed and stable.  Patient with tenderness palpation in epigastric region.  Consider GERD versus infectious etiology.  We will plan to check labs, give fluids, antiemetics.  Patient is slight hypokalemia at 3.1.  Potassium replacement given.  Lipase unremarkable.  CBC without any significant leukocytosis or anemia. Urine pregnancy negative. UA shows hgb, trace leuks. No nitrites. There is squamous epithelium.  Suspect contaminant.  No indication for treatment.  Reevaluation after medications, fluids.  Patient reports feeling much better.  We will plan to p.o. challenge.  Patient able to tolerate p.o. without any difficulty.  Vitals are stable.  Repeat abdominal exam shows improved tenderness.  Exam not concerning for appendicitis, perforation, obstruction.  At this time, no indication for further CT imaging as do not suspect surgical abdomen at this time.  Suspect that this is viral GI process that is exacerbating here GERD.  We will plan to restart Protonix and Carafate which she has taken before with good results.  Additionally, will plan to send home with Zofran. At this time, patient exhibits no emergent life-threatening condition that require further evaluation in ED or admission. Patient had ample opportunity for questions and discussion. All patient's questions were answered with full understanding. Strict return precautions discussed. Patient expresses understanding and agreement to plan.   Portions of this note were generated with Scientist, clinical (histocompatibility and immunogenetics). Dictation errors may occur despite best attempts at proofreading.   Final Clinical Impressions(s) / ED Diagnoses   Final diagnoses:  Epigastric pain  Non-intractable vomiting with  nausea, unspecified vomiting type    ED Discharge Orders  Ordered    sucralfate (CARAFATE) 1 GM/10ML suspension  3 times daily with meals & bedtime     10/13/18 1446    pantoprazole (PROTONIX) 40 MG tablet  Daily     10/13/18 1446    ondansetron (ZOFRAN ODT) 4 MG disintegrating tablet  Every 8 hours PRN     10/13/18 1446           Maxwell Caul, PA-C 10/13/18 1501    Maia Plan, MD 10/13/18 2016

## 2018-10-13 NOTE — ED Notes (Signed)
Pt with hx acid reflux, recent family GI illness, others in family have resolution of symptoms, pt reports continuing acid burning sensation center of chest up into esophagus, nausea, takes omeprazole

## 2018-10-13 NOTE — ED Triage Notes (Signed)
Patient has had N/V and " my acid reflux"  - the patient reports that she started on Thursday - patient states that she has thrown up so much her chest is burning and her acid reflux medications are not working

## 2019-03-22 IMAGING — CR DG CHEST 2V
2 series · 2 of 2 positions shown · non-contrast
Comparison: April 05, 2016

CLINICAL DATA: Cough and pain

EXAM:
CHEST - 2 VIEW

[w chest pa *]
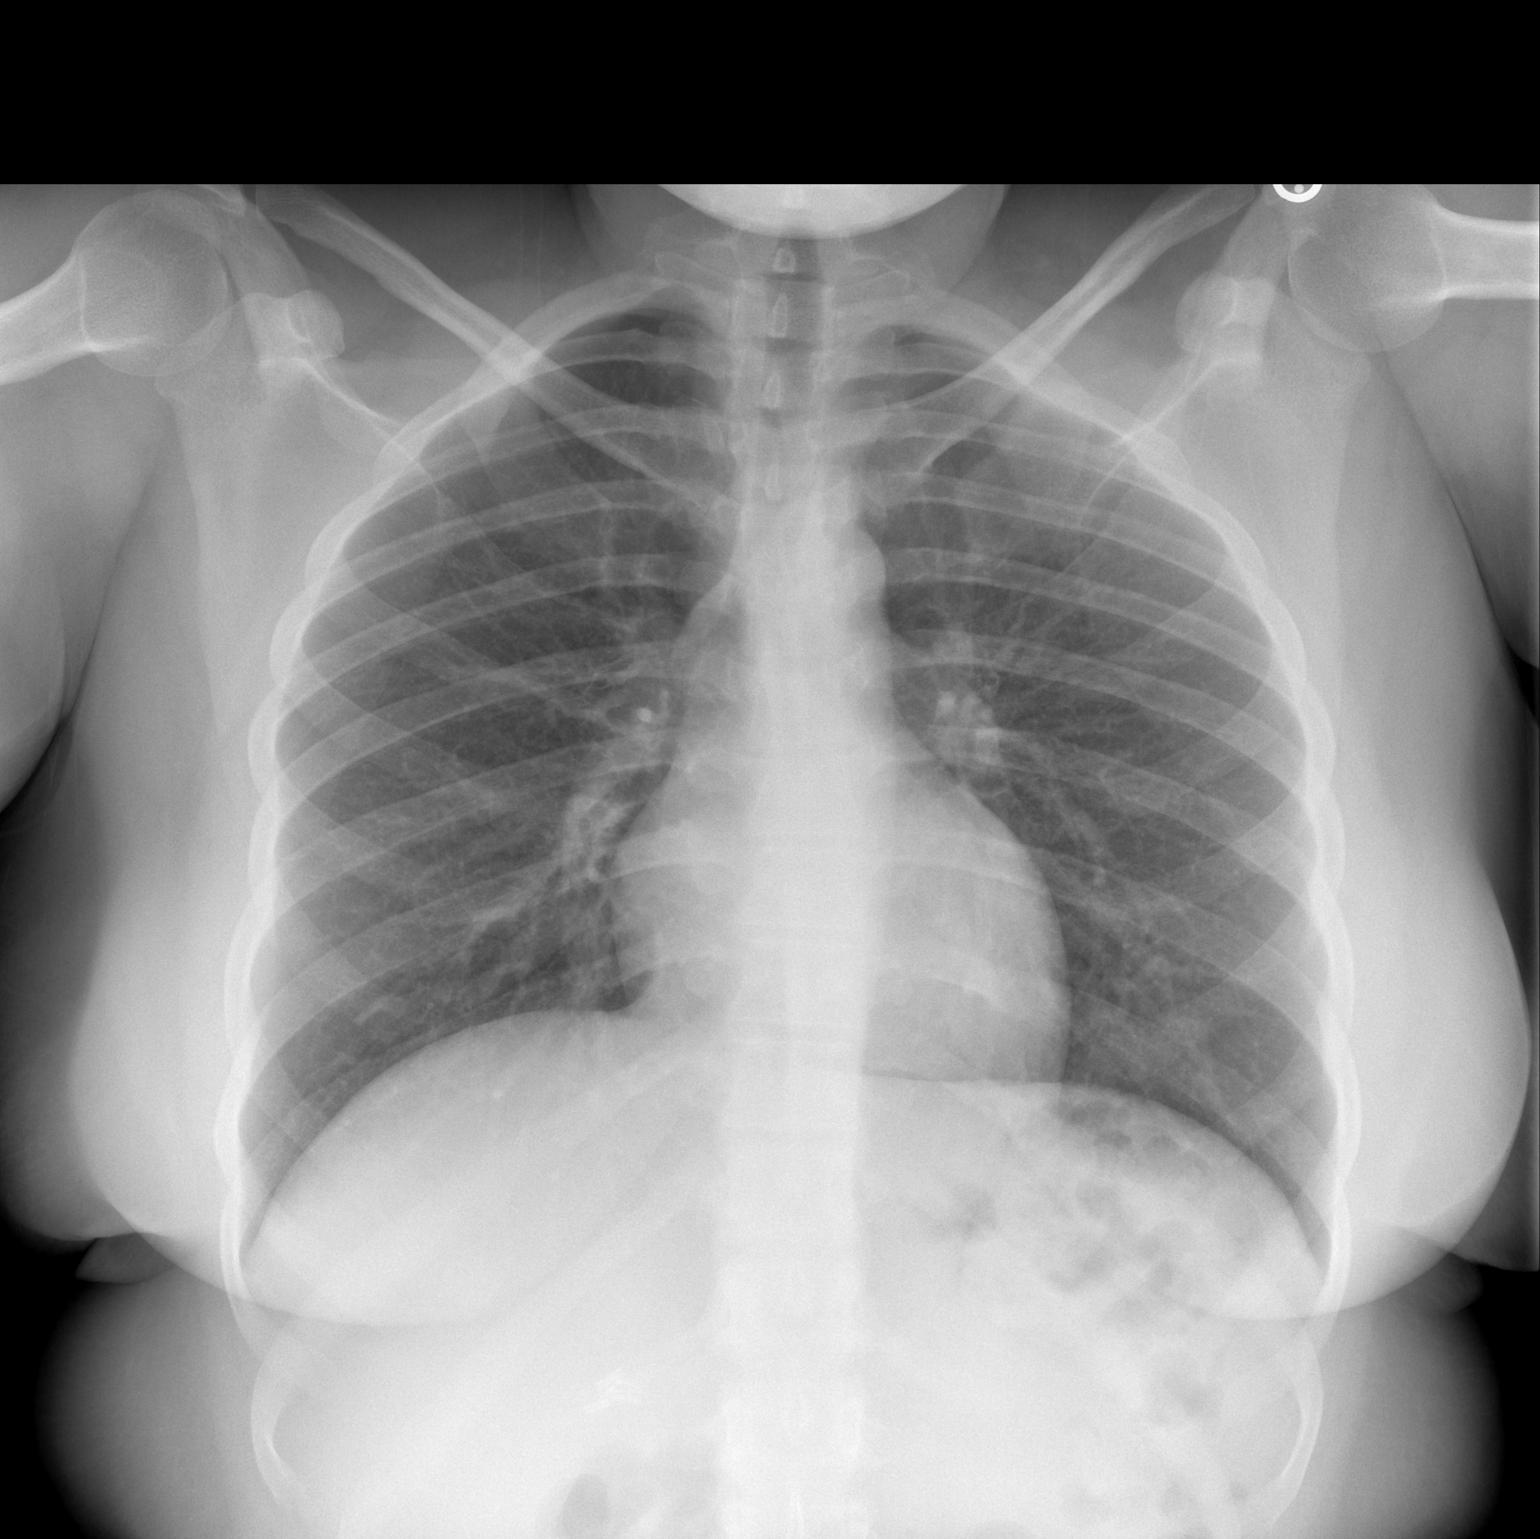

[w chest lat]
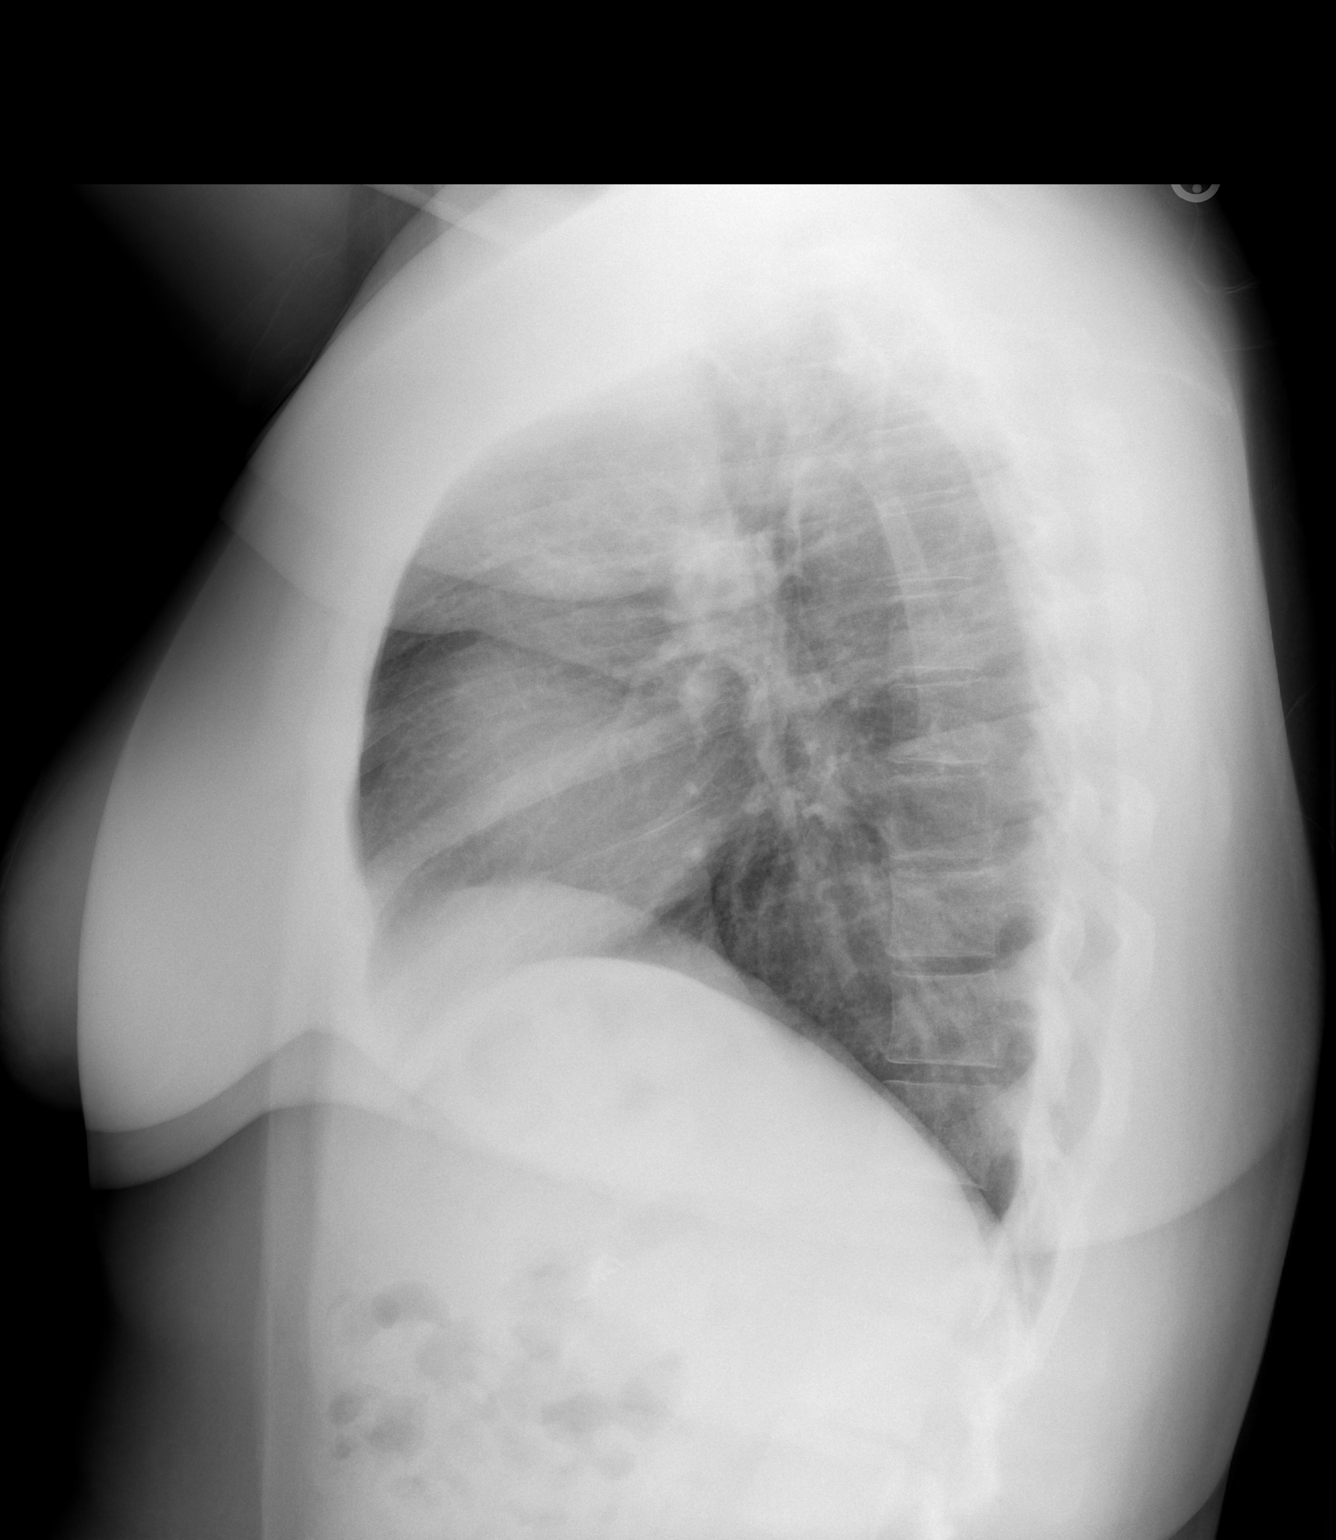

[2 of 2 positions shown; findings below may reference images not displayed]

FINDINGS: Lungs are clear. Heart size and pulmonary vascularity are normal. No
adenopathy. No pneumothorax. No bone lesions.
IMPRESSION: No edema or consolidation.

## 2019-11-23 ENCOUNTER — Other Ambulatory Visit: Payer: Self-pay

## 2019-11-23 ENCOUNTER — Encounter (HOSPITAL_BASED_OUTPATIENT_CLINIC_OR_DEPARTMENT_OTHER): Payer: Self-pay | Admitting: Emergency Medicine

## 2019-11-23 ENCOUNTER — Emergency Department (HOSPITAL_BASED_OUTPATIENT_CLINIC_OR_DEPARTMENT_OTHER)
Admission: EM | Admit: 2019-11-23 | Discharge: 2019-11-23 | Disposition: A | Discharge status: Home / Self Care | Payer: Medicaid Other | Attending: Emergency Medicine | Admitting: Emergency Medicine

## 2019-11-23 DIAGNOSIS — R112 Nausea with vomiting, unspecified: Secondary | ICD-10-CM | POA: Diagnosis present

## 2019-11-23 DIAGNOSIS — Z79899 Other long term (current) drug therapy: Secondary | ICD-10-CM | POA: Diagnosis not present

## 2019-11-23 DIAGNOSIS — Z87891 Personal history of nicotine dependence: Secondary | ICD-10-CM | POA: Insufficient documentation

## 2019-11-23 DIAGNOSIS — K2900 Acute gastritis without bleeding: Secondary | ICD-10-CM | POA: Diagnosis not present

## 2019-11-23 LAB — COMPREHENSIVE METABOLIC PANEL
ALT: 30 U/L (ref 0–44)
AST: 26 U/L (ref 15–41)
Albumin: 4.3 g/dL (ref 3.5–5.0)
Alkaline Phosphatase: 62 U/L (ref 38–126)
Anion gap: 12 (ref 5–15)
BUN: 10 mg/dL (ref 6–20)
CO2: 28 mmol/L (ref 22–32)
Calcium: 9.7 mg/dL (ref 8.9–10.3)
Chloride: 97 mmol/L — ABNORMAL LOW (ref 98–111)
Creatinine, Ser: 0.84 mg/dL (ref 0.44–1.00)
GFR calc Af Amer: >60 mL/min (ref >60)
GFR calc non Af Amer: >60 mL/min (ref >60)
Glucose, Bld: 109 mg/dL — ABNORMAL HIGH (ref 70–99)
Potassium: 3.2 mmol/L — ABNORMAL LOW (ref 3.5–5.1)
Sodium: 137 mmol/L (ref 135–145)
Total Bilirubin: 0.5 mg/dL (ref 0.3–1.2)
Total Protein: 8.9 g/dL — ABNORMAL HIGH (ref 6.5–8.1)

## 2019-11-23 LAB — CBC WITH DIFFERENTIAL/PLATELET
Abs Immature Granulocytes: 0.02 10*3/uL (ref 0.00–0.07)
Basophils Absolute: 0 10*3/uL (ref 0.0–0.1)
Basophils Relative: 0 %
Eosinophils Absolute: 0 10*3/uL (ref 0.0–0.5)
Eosinophils Relative: 0 %
HCT: 43.6 % (ref 36.0–46.0)
Hemoglobin: 14.5 g/dL (ref 12.0–15.0)
Immature Granulocytes: 0 %
Lymphocytes Relative: 23 %
Lymphs Abs: 2 10*3/uL (ref 0.7–4.0)
MCH: 29.3 pg (ref 26.0–34.0)
MCHC: 33.3 g/dL (ref 30.0–36.0)
MCV: 88.1 fL (ref 80.0–100.0)
Monocytes Absolute: 0.6 10*3/uL (ref 0.1–1.0)
Monocytes Relative: 7 %
Neutro Abs: 5.9 10*3/uL (ref 1.7–7.7)
Neutrophils Relative %: 70 %
Platelets: 356 10*3/uL (ref 150–400)
RBC: 4.95 MIL/uL (ref 3.87–5.11)
RDW: 13 % (ref 11.5–15.5)
WBC: 8.6 10*3/uL (ref 4.0–10.5)
nRBC: 0 % (ref 0.0–0.2)

## 2019-11-23 LAB — URINALYSIS, ROUTINE W REFLEX MICROSCOPIC
Glucose, UA: NEGATIVE mg/dL
Ketones, ur: 15 mg/dL — AB
Nitrite: NEGATIVE
Protein, ur: NEGATIVE mg/dL
Specific Gravity, Urine: 1.01 (ref 1.005–1.030)
pH: 6.5 (ref 5.0–8.0)

## 2019-11-23 LAB — URINALYSIS, MICROSCOPIC (REFLEX)

## 2019-11-23 LAB — LIPASE, BLOOD: Lipase: 35 U/L (ref 11–51)

## 2019-11-23 LAB — PREGNANCY, URINE: Preg Test, Ur: NEGATIVE

## 2019-11-23 MED ORDER — SODIUM CHLORIDE 0.9 % IV BOLUS
1000.0000 mL | Freq: Once | INTRAVENOUS | Status: AC
  Administered 2019-11-23: 1000 mL via INTRAVENOUS

## 2019-11-23 MED ORDER — POTASSIUM CHLORIDE CRYS ER 20 MEQ PO TBCR
40.0000 meq | EXTENDED_RELEASE_TABLET | Freq: Once | ORAL | Status: AC
  Administered 2019-11-23: 40 meq via ORAL
  Filled 2019-11-23: qty 2

## 2019-11-23 MED ORDER — ALUM & MAG HYDROXIDE-SIMETH 200-200-20 MG/5ML PO SUSP
30.0000 mL | Freq: Once | ORAL | Status: AC
  Administered 2019-11-23: 30 mL via ORAL
  Filled 2019-11-23: qty 30

## 2019-11-23 MED ORDER — LIDOCAINE VISCOUS HCL 2 % MT SOLN
15.0000 mL | Freq: Once | OROMUCOSAL | Status: AC
  Administered 2019-11-23: 15 mL via ORAL
  Filled 2019-11-23: qty 15

## 2019-11-23 MED ORDER — PROMETHAZINE HCL 25 MG RE SUPP: 25.0000 mg | Freq: Four times a day (QID) | RECTAL | 0 refills | Status: DC | PRN

## 2019-11-23 NOTE — ED Provider Notes (Signed)
[facility] EMERGENCY DEPARTMENT Provider Note   CSN: [id number] Arrival date & time: 11/23/19  1108     History Chief Complaint  Patient presents with  . Abdominal Pain    Victoria Smith is a 29 y.o. female.  29 year old female with past medical history of GERD presents with complaint of epigastric abdominal pain with nausea and vomiting for the past 3 days.  Patient is taking her Prilosec at home without improvement in her symptoms, has not had any vomiting today.  Vomiting is described as bilious, nonbloody.  No changes in bowel or bladder habits, denies fevers or chills, no sick contacts.  Patient reports multiple similar episodes previously, states she has come to the ER when he gets this severe for medication to stop her pain.  Patient reports occasional alcohol use, states that she did drink last weekend for her birthday and did smoke marijuana however does not regularly drink or use marijuana.        Past Medical History:  Diagnosis Date  . GERD (gastroesophageal reflux disease)     There are no problems to display for this patient.   Past Surgical History:  Procedure Laterality Date  . CESAREAN SECTION    . CHOLECYSTECTOMY    . TUBAL LIGATION       OB History    Gravida  3   Para  2   Term  1   Preterm  1   AB      Living  2     SAB      TAB      Ectopic      Multiple      Live Births              Family History  Problem Relation Age of Onset  . Cancer Mother   . Mental retardation Brother     Social History   Tobacco Use  . Smoking status: Former Research scientist (life sciences)  . Smokeless tobacco: Never Used  Substance Use Topics  . Alcohol use: Not Currently  . Drug use: Yes    Types: Marijuana    Home Medications Prior to Admission medications   Medication Sig Start Date End Date Taking? Authorizing Provider  metoCLOPramide (REGLAN) 10 MG tablet Take 1 tablet (10 mg total) by mouth every 8 (eight) hours as needed for nausea or  vomiting. Patient not taking: Reported on 09/22/2016 04/05/16   Sherwood Gambler, MD  omeprazole (PRILOSEC) 40 MG capsule Take 40 mg by mouth daily.    [provider]  ondansetron (ZOFRAN ODT) 4 MG disintegrating tablet Take 1 tablet (4 mg total) by mouth every 8 (eight) hours as needed for nausea or vomiting. 10/13/18   Volanda Napoleon, PA-C  ondansetron (ZOFRAN) 4 MG tablet Take 1 tablet (4 mg total) by mouth every 6 (six) hours. 11/19/17   Volanda Napoleon, PA-C  oxycodone (OXY-IR) 5 MG capsule Take 5 mg by mouth every 4 (four) hours as needed for pain.    [provider]  pantoprazole (PROTONIX) 40 MG tablet Take 1 tablet (40 mg total) by mouth daily. 10/13/18   Volanda Napoleon, PA-C  potassium chloride SA (K-DUR,KLOR-CON) 20 MEQ tablet Take 1 tablet (20 mEq total) by mouth daily. 04/05/16   Sherwood Gambler, MD  Prenatal Vit-Fe Fumarate-FA (PRENATAL VITAMIN PO) Take by mouth.    [provider]  promethazine (PHENERGAN) 25 MG suppository Place 1 suppository (25 mg total) rectally every 6 (six) hours as needed for  nausea or vomiting. 11/23/19   Jeannie Fend, PA-C  sucralfate (CARAFATE) 1 GM/10ML suspension Take 10 mLs (1 g total) by mouth 4 (four) times daily -  with meals and at bedtime. 10/13/18   Maxwell Caul, PA-C    Allergies    Patient has no known allergies.  Review of Systems   Review of Systems  Constitutional: Negative for chills, diaphoresis and fever.  Respiratory: Negative for shortness of breath.   Cardiovascular: Negative for chest pain.  Gastrointestinal: Positive for abdominal pain, nausea and vomiting. Negative for constipation and diarrhea.  Genitourinary: Negative for dysuria and frequency.  Musculoskeletal: Negative for arthralgias and myalgias.  Skin: Negative for rash and wound.  Allergic/Immunologic: Negative for immunocompromised state.  Neurological: Negative for weakness.  All other systems reviewed and are negative.   Physical  Exam Updated Vital Signs BP (!) 136/122 (BP Location: Left Arm)   Pulse 86   Temp 99 F (37.2 C) (Oral)   Resp 16   Ht 5\' 4"  (1.626 m)   Wt 97.1 kg   SpO2 100%   BMI 36.73 kg/m   Physical Exam Vitals and nursing note reviewed.  Constitutional:      General: She is not in acute distress.    Appearance: She is well-developed. She is not diaphoretic.  HENT:     Head: Normocephalic and atraumatic.  Cardiovascular:     Rate and Rhythm: Normal rate and regular rhythm.     Heart sounds: Normal heart sounds.  Pulmonary:     Effort: Pulmonary effort is normal.     Breath sounds: Normal breath sounds.  Abdominal:     Palpations: Abdomen is soft.     Tenderness: There is abdominal tenderness in the epigastric area. There is no right CVA tenderness or left CVA tenderness.  Skin:    General: Skin is warm and dry.     Findings: No erythema or rash.  Neurological:     Mental Status: She is alert and oriented to person, place, and time.  Psychiatric:        Behavior: Behavior normal.     ED Results / Procedures / Treatments   Labs (all labs ordered are listed, but only abnormal results are displayed) Labs Reviewed  COMPREHENSIVE METABOLIC PANEL - Abnormal; Notable for the following components:      Result Value   Potassium 3.2 (*)    Chloride 97 (*)    Glucose, Bld 109 (*)    Total Protein 8.9 (*)    All other components within normal limits  URINALYSIS, ROUTINE W REFLEX MICROSCOPIC - Abnormal; Notable for the following components:   APPearance CLOUDY (*)    Hgb urine dipstick MODERATE (*)    Bilirubin Urine SMALL (*)    Ketones, ur 15 (*)    Leukocytes,Ua SMALL (*)    All other components within normal limits  URINALYSIS, MICROSCOPIC (REFLEX) - Abnormal; Notable for the following components:   Bacteria, UA MANY (*)    All other components within normal limits  CBC WITH DIFFERENTIAL/PLATELET  LIPASE, BLOOD  PREGNANCY, URINE    EKG EKG  Interpretation  Date/Time:  Sunday November 23 2019 11:25:51 EDT Ventricular Rate:  76 PR Interval:    QRS Duration: 90 QT Interval:  410 QTC Calculation: 461 R Axis:   64 Text Interpretation: Sinus arrhythmia Borderline short PR interval Baseline wander in lead(s) I II III aVR aVL aVF V1 V2 V3 V4 V5 V6 Confirmed by 08-27-1978 [phone]) on  11/23/2019 11:28:37 AM   Radiology No results found.  Procedures Procedures (including critical care time)  Medications Ordered in ED Medications  sodium chloride 0.9 % bolus 1,000 mL (1,000 mLs Intravenous New Bag/Given 11/23/19 1212)  alum & mag hydroxide-simeth (MAALOX/MYLANTA) 200-200-20 MG/5ML suspension 30 mL (30 mLs Oral Given 11/23/19 1212)    And  lidocaine (XYLOCAINE) 2 % viscous mouth solution 15 mL (15 mLs Oral Given 11/23/19 1212)  potassium chloride SA (KLOR-CON) CR tablet 40 mEq (40 mEq Oral Given 11/23/19 1251)    ED Course  I have reviewed the triage vital signs and the nursing notes.  Pertinent labs & imaging results that were available during my care of the patient were reviewed by me and considered in my medical decision making (see chart for details).  Clinical Course as of Nov 23 1315  Sun Nov 23, 2019  554 29 year old female with history of GERD presents with complaint of epigastric abdominal pain with nausea and vomiting for the past 2 days.  No fevers, no changes in bowel or bladder habits.  On exam has mild epigastric tenderness, no CVA tenderness, denies chest pain or shortness of breath.  Patient was given IV fluids, Zofran, GI cocktail, is feeling better at this time, no further vomiting. Patient was found to have mild hypokalemia with potassium of 3.2, was given p.o. K-Dur and is tolerating oral fluids.  CBC is unremarkable, lipase within normal meds, hCG negative, urinalysis appears contaminated and she does not have urinary symptoms. Recommend patient continue her OTC antacid, and take Maalox if needed, given  prescription for Phenergan suppositories to use for further vomiting.  Referred to GI for follow-up.   [LM]    Clinical Course User Index [LM] Alden Hipp   MDM Rules/Calculators/A&P                      Final Clinical Impression(s) / ED Diagnoses Final diagnoses:  Acute gastritis without hemorrhage, unspecified gastritis type  Non-intractable vomiting with nausea, unspecified vomiting type    Rx / DC Orders ED Discharge Orders         Ordered    promethazine (PHENERGAN) 25 MG suppository  Every 6 hours PRN     11/23/19 1315           Jeannie Fend, PA-C 11/23/19 1317    Virgina Norfolk, DO 11/23/19 1333

## 2019-11-23 NOTE — ED Triage Notes (Signed)
Pt c/o upper abdominal pain x 3 days. Pt endorses hx of GERD, reports N/V

## 2019-11-23 NOTE — Discharge Instructions (Addendum)
Bland diet at home until symptoms improve.  Use Phenergan suppositories if vomiting continues.  Continue with your over-the-counter antacid, you can also take Maalox if needed.  Follow-up with GI, referral given.

## 2020-02-28 ENCOUNTER — Other Ambulatory Visit: Payer: Self-pay

## 2020-02-28 ENCOUNTER — Emergency Department (HOSPITAL_BASED_OUTPATIENT_CLINIC_OR_DEPARTMENT_OTHER): Payer: Self-pay

## 2020-02-28 ENCOUNTER — Encounter (HOSPITAL_BASED_OUTPATIENT_CLINIC_OR_DEPARTMENT_OTHER): Payer: Self-pay | Admitting: Emergency Medicine

## 2020-02-28 ENCOUNTER — Emergency Department (HOSPITAL_BASED_OUTPATIENT_CLINIC_OR_DEPARTMENT_OTHER)
Admission: EM | Admit: 2020-02-28 | Discharge: 2020-02-28 | Disposition: A | Discharge status: Home / Self Care | Payer: Self-pay | Attending: Emergency Medicine | Admitting: Emergency Medicine

## 2020-02-28 DIAGNOSIS — Z87891 Personal history of nicotine dependence: Secondary | ICD-10-CM | POA: Insufficient documentation

## 2020-02-28 DIAGNOSIS — K219 Gastro-esophageal reflux disease without esophagitis: Secondary | ICD-10-CM | POA: Insufficient documentation

## 2020-02-28 DIAGNOSIS — Z6837 Body mass index (BMI) 37.0-37.9, adult: Secondary | ICD-10-CM | POA: Insufficient documentation

## 2020-02-28 DIAGNOSIS — E669 Obesity, unspecified: Secondary | ICD-10-CM | POA: Insufficient documentation

## 2020-02-28 DIAGNOSIS — R12 Heartburn: Secondary | ICD-10-CM | POA: Insufficient documentation

## 2020-02-28 DIAGNOSIS — Z9049 Acquired absence of other specified parts of digestive tract: Secondary | ICD-10-CM | POA: Insufficient documentation

## 2020-02-28 DIAGNOSIS — Z8759 Personal history of other complications of pregnancy, childbirth and the puerperium: Secondary | ICD-10-CM | POA: Insufficient documentation

## 2020-02-28 DIAGNOSIS — R1115 Cyclical vomiting syndrome unrelated to migraine: Secondary | ICD-10-CM | POA: Insufficient documentation

## 2020-02-28 DIAGNOSIS — Z9851 Tubal ligation status: Secondary | ICD-10-CM | POA: Insufficient documentation

## 2020-02-28 LAB — BASIC METABOLIC PANEL
Anion gap: 13 (ref 5–15)
BUN: 11 mg/dL (ref 6–20)
CO2: 24 mmol/L (ref 22–32)
Calcium: 9.3 mg/dL (ref 8.9–10.3)
Chloride: 101 mmol/L (ref 98–111)
Creatinine, Ser: 0.73 mg/dL (ref 0.44–1.00)
GFR calc Af Amer: >60 mL/min (ref >60)
GFR calc non Af Amer: >60 mL/min (ref >60)
Glucose, Bld: 118 mg/dL — ABNORMAL HIGH (ref 70–99)
Potassium: 3.6 mmol/L (ref 3.5–5.1)
Sodium: 138 mmol/L (ref 135–145)

## 2020-02-28 LAB — HEPATIC FUNCTION PANEL
ALT: 21 U/L (ref 0–44)
AST: 27 U/L (ref 15–41)
Albumin: 4 g/dL (ref 3.5–5.0)
Alkaline Phosphatase: 47 U/L (ref 38–126)
Bilirubin, Direct: 0.3 mg/dL — ABNORMAL HIGH (ref 0.0–0.2)
Indirect Bilirubin: 0.5 mg/dL (ref 0.3–0.9)
Total Bilirubin: 0.8 mg/dL (ref 0.3–1.2)
Total Protein: 8.3 g/dL — ABNORMAL HIGH (ref 6.5–8.1)

## 2020-02-28 LAB — CBC
HCT: 37.9 % (ref 36.0–46.0)
Hemoglobin: 12.7 g/dL (ref 12.0–15.0)
MCH: 29.2 pg (ref 26.0–34.0)
MCHC: 33.5 g/dL (ref 30.0–36.0)
MCV: 87.1 fL (ref 80.0–100.0)
Platelets: 313 10*3/uL (ref 150–400)
RBC: 4.35 MIL/uL (ref 3.87–5.11)
RDW: 13.4 % (ref 11.5–15.5)
WBC: 8.5 10*3/uL (ref 4.0–10.5)
nRBC: 0 % (ref 0.0–0.2)

## 2020-02-28 LAB — LIPASE, BLOOD: Lipase: 37 U/L (ref 11–51)

## 2020-02-28 LAB — PREGNANCY, URINE: Preg Test, Ur: NEGATIVE

## 2020-02-28 LAB — TROPONIN I (HIGH SENSITIVITY): Troponin I (High Sensitivity): 2 ng/L (ref <18)

## 2020-02-28 MED ORDER — OMEPRAZOLE 40 MG PO CPDR
40.0000 mg | DELAYED_RELEASE_CAPSULE | Freq: Every day | ORAL | 0 refills | Status: DC
Start: 2020-02-28 — End: 2023-01-20

## 2020-02-28 MED ORDER — ALUM & MAG HYDROXIDE-SIMETH 200-200-20 MG/5ML PO SUSP
30.0000 mL | Freq: Once | ORAL | Status: AC
  Administered 2020-02-28: 30 mL via ORAL
  Filled 2020-02-28: qty 30

## 2020-02-28 MED ORDER — METOCLOPRAMIDE HCL 5 MG/ML IJ SOLN
10.0000 mg | Freq: Once | INTRAMUSCULAR | Status: AC
  Administered 2020-02-28: 10 mg via INTRAVENOUS
  Filled 2020-02-28: qty 2

## 2020-02-28 MED ORDER — FAMOTIDINE 20 MG PO TABS
20.0000 mg | ORAL_TABLET | Freq: Two times a day (BID) | ORAL | 0 refills | Status: DC
Start: 2020-02-28 — End: 2023-01-20

## 2020-02-28 MED ORDER — LIDOCAINE VISCOUS HCL 2 % MT SOLN
15.0000 mL | Freq: Once | OROMUCOSAL | Status: AC
  Administered 2020-02-28: 15 mL via ORAL
  Filled 2020-02-28: qty 15

## 2020-02-28 MED ORDER — SODIUM CHLORIDE 0.9 % IV BOLUS
1000.0000 mL | Freq: Once | INTRAVENOUS | Status: AC
  Administered 2020-02-28: 1000 mL via INTRAVENOUS

## 2020-02-28 NOTE — ED Triage Notes (Signed)
Pt states she cannot get her reflux under control x 3 days. Reports burning in her chest. Takes omeprazole.

## 2020-02-28 NOTE — ED Notes (Signed)
Repeated EKG, due to arm reversal in triage

## 2020-02-28 NOTE — Discharge Instructions (Addendum)
If you develop recurrent, continued, or worsening chest pain, shortness of breath, fever, vomiting, abdominal or back pain, or any other new/concerning symptoms then return to the ER for evaluation.  

## 2020-02-28 NOTE — ED Provider Notes (Signed)
[facility] EMERGENCY DEPARTMENT Provider Note   CSN: [id number] Arrival date & time: 02/28/20  1121     History Chief Complaint  Patient presents with  . Chest Pain    Victoria Smith is a 29 y.o. female.  HPI 29 year old female presents with recurrent vomiting and reflux.  Ongoing for 2-3 days.  Has had multiple episodes of vomiting and feeling like her chest has a burning sensation.  She states she also has some pain from her epigastrium.  She has been back and forth to the ER multiple times with attacks similar to this over the years.  She takes omeprazole 20 mg and occasionally will double up before going out to eat.  Takes no other meds.  No fevers.   Past Medical History:  Diagnosis Date  . GERD (gastroesophageal reflux disease)     There are no problems to display for this patient.   Past Surgical History:  Procedure Laterality Date  . CESAREAN SECTION    . CHOLECYSTECTOMY    . TUBAL LIGATION       OB History    Gravida  3   Para  2   Term  1   Preterm  1   AB      Living  2     SAB      TAB      Ectopic      Multiple      Live Births              Family History  Problem Relation Age of Onset  . Cancer Mother   . Mental retardation Brother     Social History   Tobacco Use  . Smoking status: Former Games developer  . Smokeless tobacco: Never Used  Substance Use Topics  . Alcohol use: Not Currently  . Drug use: Yes    Types: Marijuana    Home Medications Prior to Admission medications   Medication Sig Start Date End Date Taking? Authorizing Provider  famotidine (PEPCID) 20 MG tablet Take 1 tablet (20 mg total) by mouth 2 (two) times daily. 02/28/20   Pricilla Loveless, MD  omeprazole (PRILOSEC) 40 MG capsule Take 1 capsule (40 mg total) by mouth daily. 02/28/20   Pricilla Loveless, MD  ondansetron (ZOFRAN ODT) 4 MG disintegrating tablet Take 1 tablet (4 mg total) by mouth every 8 (eight) hours as needed for nausea or vomiting.  10/13/18   Maxwell Caul, PA-C  oxycodone (OXY-IR) 5 MG capsule Take 5 mg by mouth every 4 (four) hours as needed for pain.    [provider]  Prenatal Vit-Fe Fumarate-FA (PRENATAL VITAMIN PO) Take by mouth.    [provider]  promethazine (PHENERGAN) 25 MG suppository Place 1 suppository (25 mg total) rectally every 6 (six) hours as needed for nausea or vomiting. 11/23/19   Jeannie Fend, PA-C  sucralfate (CARAFATE) 1 GM/10ML suspension Take 10 mLs (1 g total) by mouth 4 (four) times daily -  with meals and at bedtime. 10/13/18   Maxwell Caul, PA-C  metoCLOPramide (REGLAN) 10 MG tablet Take 1 tablet (10 mg total) by mouth every 8 (eight) hours as needed for nausea or vomiting. Patient not taking: Reported on 09/22/2016 04/05/16 02/28/20  Pricilla Loveless, MD  pantoprazole (PROTONIX) 40 MG tablet Take 1 tablet (40 mg total) by mouth daily. 10/13/18 02/28/20  Graciella Freer A, PA-C  potassium chloride SA (K-DUR,KLOR-CON) 20 MEQ tablet Take 1 tablet (20 mEq total) by mouth daily.  04/05/16 02/28/20  Pricilla Loveless, MD    Allergies    Patient has no known allergies.  Review of Systems   Review of Systems  Constitutional: Negative for fever.  Cardiovascular: Positive for chest pain.  Gastrointestinal: Positive for abdominal pain and vomiting. Negative for diarrhea.  All other systems reviewed and are negative.   Physical Exam Updated Vital Signs BP 112/84 (BP Location: Right Arm)   Pulse 56   Temp 99.3 F (37.4 C) (Oral)   Resp 16   Ht 5\' 4"  (1.626 m)   Wt (!) 99.3 kg   LMP 01/29/2020   SpO2 95%   BMI 37.59 kg/m   Physical Exam Vitals and nursing note reviewed.  Constitutional:      Appearance: She is well-developed. She is obese.  HENT:     Head: Normocephalic and atraumatic.     Right Ear: External ear normal.     Left Ear: External ear normal.     Nose: Nose normal.  Eyes:     General:        Right eye: No discharge.        Left eye: No discharge.    Cardiovascular:     Rate and Rhythm: Normal rate and regular rhythm.     Heart sounds: Normal heart sounds.  Pulmonary:     Effort: Pulmonary effort is normal.     Breath sounds: Normal breath sounds.  Abdominal:     Palpations: Abdomen is soft.     Tenderness: There is no abdominal tenderness.  Skin:    General: Skin is warm and dry.  Neurological:     Mental Status: She is alert.  Psychiatric:        Mood and Affect: Mood is not anxious.     ED Results / Procedures / Treatments   Labs (all labs ordered are listed, but only abnormal results are displayed) Labs Reviewed  BASIC METABOLIC PANEL - Abnormal; Notable for the following components:      Result Value   Glucose, Bld 118 (*)    All other components within normal limits  HEPATIC FUNCTION PANEL - Abnormal; Notable for the following components:   Total Protein 8.3 (*)    Bilirubin, Direct 0.3 (*)    All other components within normal limits  CBC  PREGNANCY, URINE  LIPASE, BLOOD  TROPONIN I (HIGH SENSITIVITY)    EKG EKG Interpretation  Date/Time:  Saturday February 28 2020 11:38:46 EDT Ventricular Rate:  74 PR Interval:  138 QRS Duration: 84 QT Interval:  400 QTC Calculation: 444 R Axis:   126 Text Interpretation: ** Suspect arm lead reversal, interpretation assumes no reversal Normal sinus rhythm with sinus arrhythmia Left posterior fascicular block Nonspecific ST abnormality Abnormal ECG needs repeat Confirmed by 01-24-1990 [phone]) on 02/28/2020 2:10:28 PM   Radiology DG Chest 2 View  Result Date: 02/28/2020 CLINICAL DATA:  Chest pain, burning in chest, reflux EXAM: CHEST - 2 VIEW COMPARISON:  08/20/2018 FINDINGS: Normal heart size, mediastinal contours, and pulmonary vascularity. Lungs clear. No pleural effusion or pneumothorax. Bones unremarkable. IMPRESSION: Normal exam. Electronically Signed   By: 08/22/2018 M.D.   On: 02/28/2020 12:20    Procedures Procedures (including critical care  time)  Medications Ordered in ED Medications  alum & mag hydroxide-simeth (MAALOX/MYLANTA) 200-200-20 MG/5ML suspension 30 mL (30 mLs Oral Given 02/28/20 1504)    And  lidocaine (XYLOCAINE) 2 % viscous mouth solution 15 mL (15 mLs Oral Given 02/28/20 1504)  sodium  chloride 0.9 % bolus 1,000 mL (1,000 mLs Intravenous New Bag/Given 02/28/20 1514)  metoCLOPramide (REGLAN) injection 10 mg (10 mg Intravenous Given 02/28/20 1518)    ED Course  I have reviewed the triage vital signs and the nursing notes.  Pertinent labs & imaging results that were available during my care of the patient were reviewed by me and considered in my medical decision making (see chart for details).    MDM Rules/Calculators/A&P                          Patient is feeling significantly better after GI cocktail.  Given the vomiting I will give her some fluids and Reglan.  Otherwise her labs have been reviewed as well as her ECG (first one was poor quality and arm lead reversal), and no significant findings.  She has been seen for epigastric pain and reflux many times.  Had a CT multiple years ago but I do not think repeat imaging is currently needed.  Will discharge home after her fluids and recommend follow-up with GI.  Will adjust her Prilosec dose from 20 mg to 40 mg and add Pepcid. Final Clinical Impression(s) / ED Diagnoses Final diagnoses:  Gastroesophageal reflux disease, unspecified whether esophagitis present    Rx / DC Orders ED Discharge Orders         Ordered    omeprazole (PRILOSEC) 40 MG capsule  Daily     Discontinue  Reprint     02/28/20 1518    famotidine (PEPCID) 20 MG tablet  2 times daily     Discontinue  Reprint     02/28/20 1518           Pricilla Loveless, MD 02/28/20 1530

## 2020-06-28 ENCOUNTER — Encounter (HOSPITAL_BASED_OUTPATIENT_CLINIC_OR_DEPARTMENT_OTHER): Payer: Self-pay | Admitting: Emergency Medicine

## 2020-06-28 ENCOUNTER — Other Ambulatory Visit: Payer: Self-pay

## 2020-06-28 ENCOUNTER — Emergency Department (HOSPITAL_BASED_OUTPATIENT_CLINIC_OR_DEPARTMENT_OTHER)
Admission: EM | Admit: 2020-06-28 | Discharge: 2020-06-28 | Disposition: A | Discharge status: Home / Self Care | Payer: Self-pay | Attending: Emergency Medicine | Admitting: Emergency Medicine

## 2020-06-28 DIAGNOSIS — R1013 Epigastric pain: Secondary | ICD-10-CM | POA: Insufficient documentation

## 2020-06-28 DIAGNOSIS — Z87891 Personal history of nicotine dependence: Secondary | ICD-10-CM | POA: Insufficient documentation

## 2020-06-28 DIAGNOSIS — K219 Gastro-esophageal reflux disease without esophagitis: Secondary | ICD-10-CM | POA: Insufficient documentation

## 2020-06-28 LAB — COMPREHENSIVE METABOLIC PANEL
ALT: 19 U/L (ref 0–44)
AST: 20 U/L (ref 15–41)
Albumin: 4.1 g/dL (ref 3.5–5.0)
Alkaline Phosphatase: 62 U/L (ref 38–126)
Anion gap: 11 (ref 5–15)
BUN: 11 mg/dL (ref 6–20)
CO2: 25 mmol/L (ref 22–32)
Calcium: 9.3 mg/dL (ref 8.9–10.3)
Chloride: 99 mmol/L (ref 98–111)
Creatinine, Ser: 0.87 mg/dL (ref 0.44–1.00)
GFR, Estimated: >60 mL/min (ref >60)
Glucose, Bld: 93 mg/dL (ref 70–99)
Potassium: 3.4 mmol/L — ABNORMAL LOW (ref 3.5–5.1)
Sodium: 135 mmol/L (ref 135–145)
Total Bilirubin: 0.5 mg/dL (ref 0.3–1.2)
Total Protein: 8.4 g/dL — ABNORMAL HIGH (ref 6.5–8.1)

## 2020-06-28 LAB — CBC WITH DIFFERENTIAL/PLATELET
Abs Immature Granulocytes: 0.02 10*3/uL (ref 0.00–0.07)
Basophils Absolute: 0 10*3/uL (ref 0.0–0.1)
Basophils Relative: 0 %
Eosinophils Absolute: 0 10*3/uL (ref 0.0–0.5)
Eosinophils Relative: 0 %
HCT: 41.7 % (ref 36.0–46.0)
Hemoglobin: 14.1 g/dL (ref 12.0–15.0)
Immature Granulocytes: 0 %
Lymphocytes Relative: 25 %
Lymphs Abs: 2.5 10*3/uL (ref 0.7–4.0)
MCH: 29.3 pg (ref 26.0–34.0)
MCHC: 33.8 g/dL (ref 30.0–36.0)
MCV: 86.7 fL (ref 80.0–100.0)
Monocytes Absolute: 0.9 10*3/uL (ref 0.1–1.0)
Monocytes Relative: 9 %
Neutro Abs: 6.5 10*3/uL (ref 1.7–7.7)
Neutrophils Relative %: 66 %
Platelets: 346 10*3/uL (ref 150–400)
RBC: 4.81 MIL/uL (ref 3.87–5.11)
RDW: 13.4 % (ref 11.5–15.5)
WBC: 9.9 10*3/uL (ref 4.0–10.5)
nRBC: 0 % (ref 0.0–0.2)

## 2020-06-28 LAB — LIPASE, BLOOD: Lipase: 31 U/L (ref 11–51)

## 2020-06-28 MED ORDER — SODIUM CHLORIDE 0.9 % IV BOLUS
1000.0000 mL | Freq: Once | INTRAVENOUS | Status: AC
  Administered 2020-06-28: 1000 mL via INTRAVENOUS

## 2020-06-28 MED ORDER — KETOROLAC TROMETHAMINE 30 MG/ML IJ SOLN
30.0000 mg | Freq: Once | INTRAMUSCULAR | Status: AC
  Administered 2020-06-28: 30 mg via INTRAVENOUS
  Filled 2020-06-28: qty 1

## 2020-06-28 MED ORDER — ALUM & MAG HYDROXIDE-SIMETH 200-200-20 MG/5ML PO SUSP
30.0000 mL | Freq: Once | ORAL | Status: AC
  Administered 2020-06-28: 30 mL via ORAL
  Filled 2020-06-28: qty 30

## 2020-06-28 MED ORDER — ONDANSETRON HCL 4 MG/2ML IJ SOLN
4.0000 mg | Freq: Once | INTRAMUSCULAR | Status: AC
  Administered 2020-06-28: 4 mg via INTRAVENOUS
  Filled 2020-06-28: qty 2

## 2020-06-28 MED ORDER — LIDOCAINE VISCOUS HCL 2 % MT SOLN
15.0000 mL | Freq: Once | OROMUCOSAL | Status: AC
  Administered 2020-06-28: 15 mL via ORAL
  Filled 2020-06-28: qty 15

## 2020-06-28 NOTE — ED Provider Notes (Signed)
[facility] EMERGENCY DEPARTMENT Provider Note   CSN: [id number] Arrival date & time: 06/28/20  1823     History Chief Complaint  Patient presents with  . Gastroesophageal Reflux    Victoria Smith is a 29 y.o. female.  Patient is a 29 year old female with past medical history of GERD.  This has been an ongoing problem for quite some time.  This seems to flareup every several months and patient ends up in the emergency department.  She has not seen a GI doctor for financial reasons.  She describes a burning pain in the center of her chest/epigastric region that is constant.  It started after she ate egg drop soup at a [employer] yesterday.  She has tried over-the-counter medications with little relief.  The history is provided by the patient.  Gastroesophageal Reflux This is a recurrent problem. The current episode started yesterday. The problem occurs constantly. The problem has been rapidly worsening. Associated symptoms include abdominal pain. Exacerbated by: Movement and palpation. Nothing relieves the symptoms. She has tried nothing for the symptoms.       Past Medical History:  Diagnosis Date  . GERD (gastroesophageal reflux disease)     There are no problems to display for this patient.   Past Surgical History:  Procedure Laterality Date  . CESAREAN SECTION    . CHOLECYSTECTOMY    . TUBAL LIGATION       OB History    Gravida  3   Para  2   Term  1   Preterm  1   AB      Living  2     SAB      TAB      Ectopic      Multiple      Live Births              Family History  Problem Relation Age of Onset  . Cancer Mother   . Mental retardation Brother     Social History   Tobacco Use  . Smoking status: Former Games developer  . Smokeless tobacco: Never Used  Substance Use Topics  . Alcohol use: Not Currently  . Drug use: Yes    Types: Marijuana    Home Medications Prior to Admission medications   Medication Sig Start Date  End Date Taking? Authorizing Provider  famotidine (PEPCID) 20 MG tablet Take 1 tablet (20 mg total) by mouth 2 (two) times daily. 02/28/20   Pricilla Loveless, MD  omeprazole (PRILOSEC) 40 MG capsule Take 1 capsule (40 mg total) by mouth daily. 02/28/20   Pricilla Loveless, MD  ondansetron (ZOFRAN ODT) 4 MG disintegrating tablet Take 1 tablet (4 mg total) by mouth every 8 (eight) hours as needed for nausea or vomiting. 10/13/18   Maxwell Caul, PA-C  oxycodone (OXY-IR) 5 MG capsule Take 5 mg by mouth every 4 (four) hours as needed for pain.    [provider]  Prenatal Vit-Fe Fumarate-FA (PRENATAL VITAMIN PO) Take by mouth.    [provider]  promethazine (PHENERGAN) 25 MG suppository Place 1 suppository (25 mg total) rectally every 6 (six) hours as needed for nausea or vomiting. 11/23/19   Jeannie Fend, PA-C  sucralfate (CARAFATE) 1 GM/10ML suspension Take 10 mLs (1 g total) by mouth 4 (four) times daily -  with meals and at bedtime. 10/13/18   Maxwell Caul, PA-C  metoCLOPramide (REGLAN) 10 MG tablet Take 1 tablet (10 mg total) by mouth every 8 (  eight) hours as needed for nausea or vomiting. Patient not taking: Reported on 09/22/2016 04/05/16 02/28/20  Pricilla Loveless, MD  pantoprazole (PROTONIX) 40 MG tablet Take 1 tablet (40 mg total) by mouth daily. 10/13/18 02/28/20  Graciella Freer A, PA-C  potassium chloride SA (K-DUR,KLOR-CON) 20 MEQ tablet Take 1 tablet (20 mEq total) by mouth daily. 04/05/16 02/28/20  Pricilla Loveless, MD    Allergies    Patient has no known allergies.  Review of Systems   Review of Systems  Gastrointestinal: Positive for abdominal pain.  All other systems reviewed and are negative.   Physical Exam Updated Vital Signs BP (!) 148/89 (BP Location: Right Arm)   Pulse 68   Temp 98.7 F (37.1 C) (Oral)   Resp (!) 22   Ht 5\' 4"  (1.626 m)   Wt 97.5 kg   LMP 06/15/2020   SpO2 100%   BMI 36.90 kg/m   Physical Exam Vitals and nursing note reviewed.    Constitutional:      General: She is not in acute distress.    Appearance: She is well-developed. She is not diaphoretic.  HENT:     Head: Normocephalic and atraumatic.  Cardiovascular:     Rate and Rhythm: Normal rate and regular rhythm.     Heart sounds: No murmur heard.  No friction rub. No gallop.   Pulmonary:     Effort: Pulmonary effort is normal. No respiratory distress.     Breath sounds: Normal breath sounds. No wheezing.  Abdominal:     General: Bowel sounds are normal. There is no distension.     Palpations: Abdomen is soft.     Tenderness: There is abdominal tenderness. There is no right CVA tenderness, left CVA tenderness, guarding or rebound.     Comments: There is tenderness to palpation in the epigastric region.  Musculoskeletal:        General: Normal range of motion.     Cervical back: Normal range of motion and neck supple.  Skin:    General: Skin is warm and dry.  Neurological:     Mental Status: She is alert and oriented to person, place, and time.     ED Results / Procedures / Treatments   Labs (all labs ordered are listed, but only abnormal results are displayed) Labs Reviewed  COMPREHENSIVE METABOLIC PANEL  CBC WITH DIFFERENTIAL/PLATELET  LIPASE, BLOOD    EKG None  Radiology No results found.  Procedures Procedures (including critical care time)  Medications Ordered in ED Medications  alum & mag hydroxide-simeth (MAALOX/MYLANTA) 200-200-20 MG/5ML suspension 30 mL (has no administration in time range)    And  lidocaine (XYLOCAINE) 2 % viscous mouth solution 15 mL (has no administration in time range)  sodium chloride 0.9 % bolus 1,000 mL (has no administration in time range)  ondansetron (ZOFRAN) injection 4 mg (has no administration in time range)  ketorolac (TORADOL) 30 MG/ML injection 30 mg (has no administration in time range)    ED Course  I have reviewed the triage vital signs and the nursing notes.  Pertinent labs & imaging  results that were available during my care of the patient were reviewed by me and considered in my medical decision making (see chart for details).    MDM Rules/Calculators/A&P  Patient presenting here with complaints of epigastric burning.  She has history of recurrent gastritis and this feels similar.  Patient's laboratory studies are reassuring and she is feeling better after receiving medications here in the ER.  Patient seems appropriate for discharge.  She is to follow-up with gastroenterology.  I have advised her to resume taking her omeprazole, 20 mg twice daily.  Final Clinical Impression(s) / ED Diagnoses Final diagnoses:  None    Rx / DC Orders ED Discharge Orders    None       Geoffery Lyons, MD 06/28/20 2128

## 2020-06-28 NOTE — ED Notes (Signed)
States having "acid reflux" for the past 2 years. States it worse since her cholecystectomy approx 2 years ago. Does not take any Rx meds, taking OTC. Has not taken it for the past 2 days. Has taken PeptoBismol. Mylanta also.

## 2020-06-28 NOTE — ED Notes (Signed)
Discharge instructions reviewed. OTC medications discussed. Departs ED at this time in stable condition.

## 2020-06-28 NOTE — ED Triage Notes (Signed)
Stomach burning since Saturday. Pt states cant afford to see a GI MD. Has these sx frequently.

## 2020-06-28 NOTE — Discharge Instructions (Addendum)
Resume taking omeprazole 20 mg twice daily.  Refrain from eating spicy or acidic foods.  Follow-up with gastroenterology in the near future, and return to the ER if symptoms significantly worsen or change.

## 2020-06-28 NOTE — ED Notes (Signed)
ED Provider at bedside. 

## 2020-06-28 NOTE — ED Notes (Signed)
Pt cycling vitals 

## 2020-09-29 IMAGING — CR DG CHEST 2V
2 series · 2 of 2 positions shown · non-contrast
Comparison: 08/20/2018

CLINICAL DATA: Chest pain, burning in chest, reflux

EXAM:
CHEST - 2 VIEW

[w chest pa]
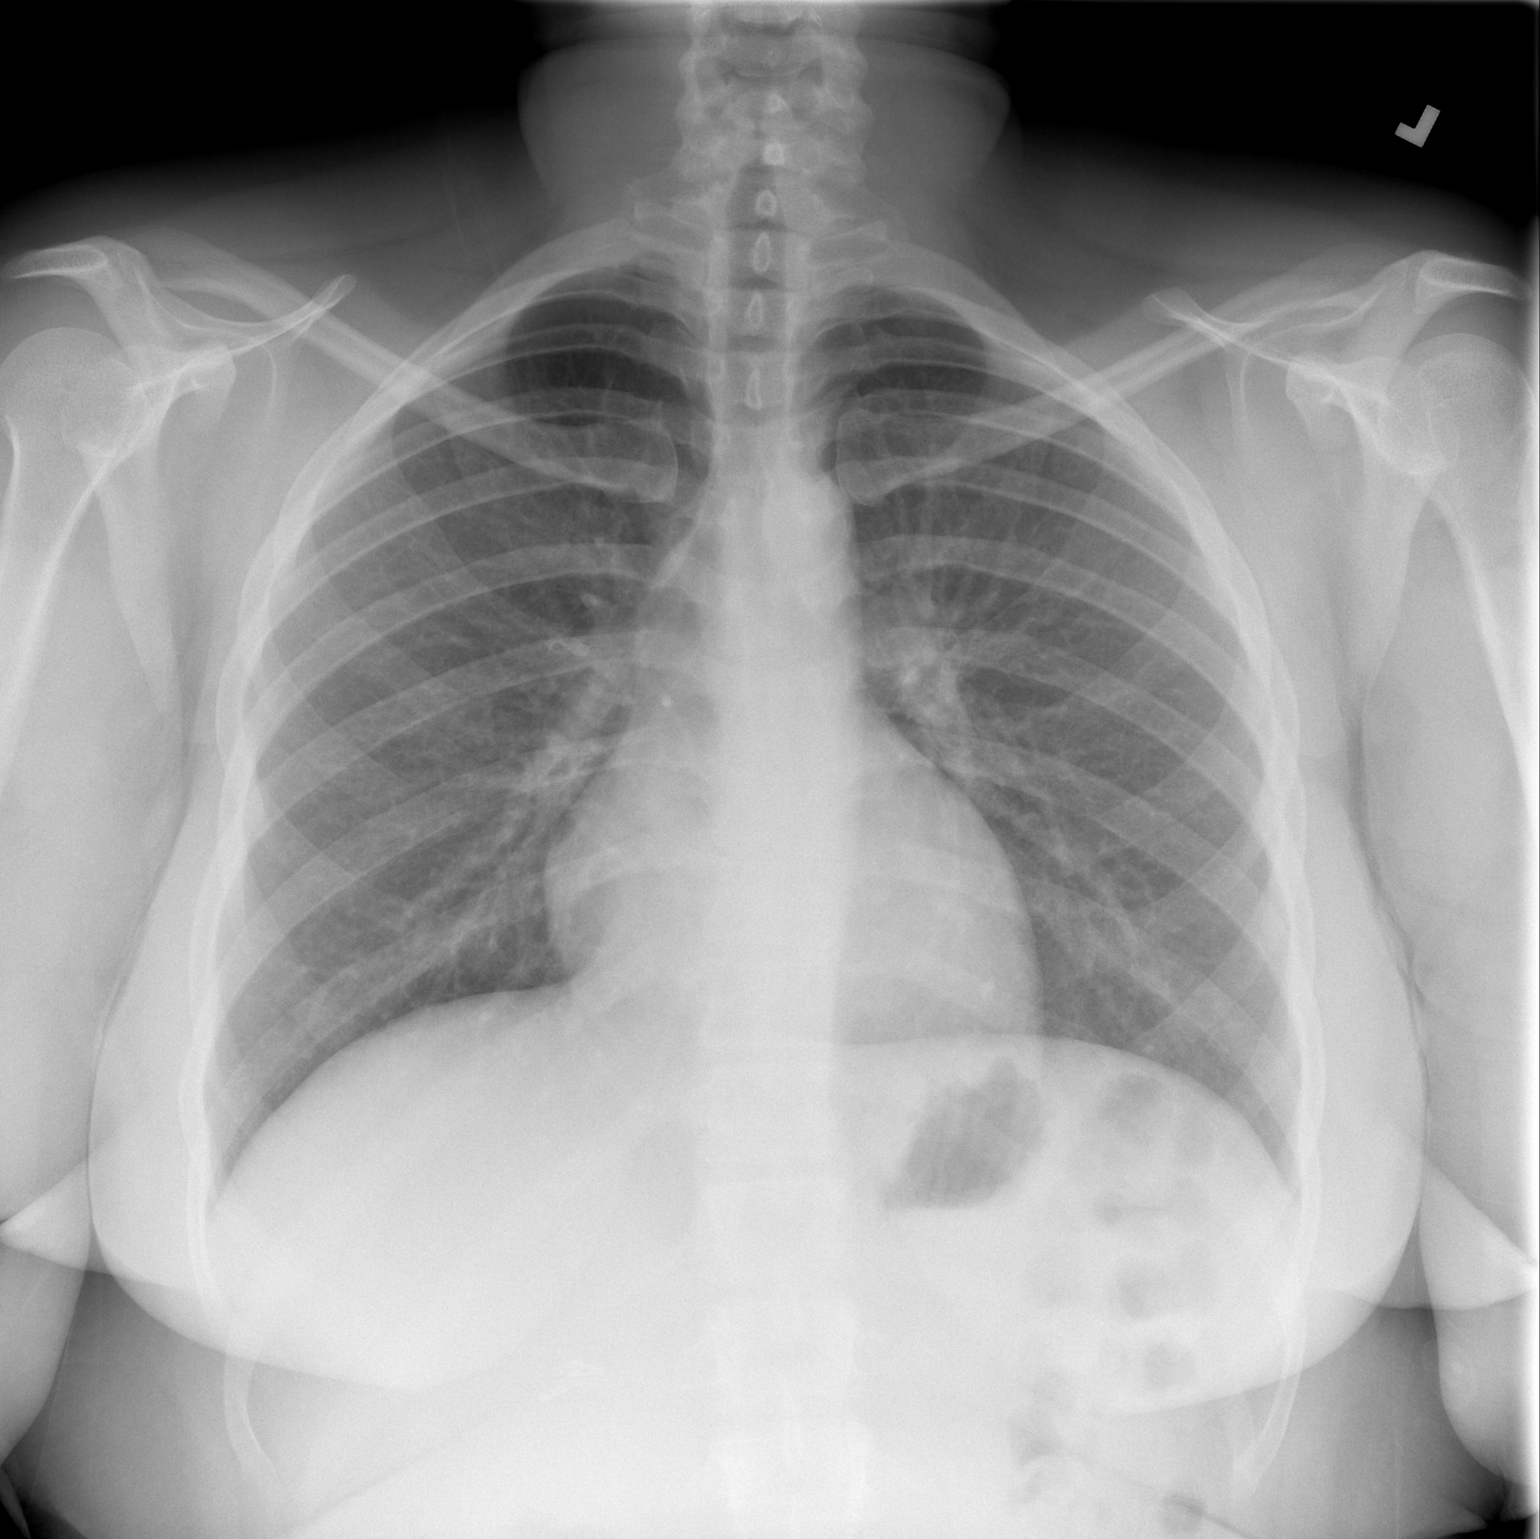

[w chest lat]
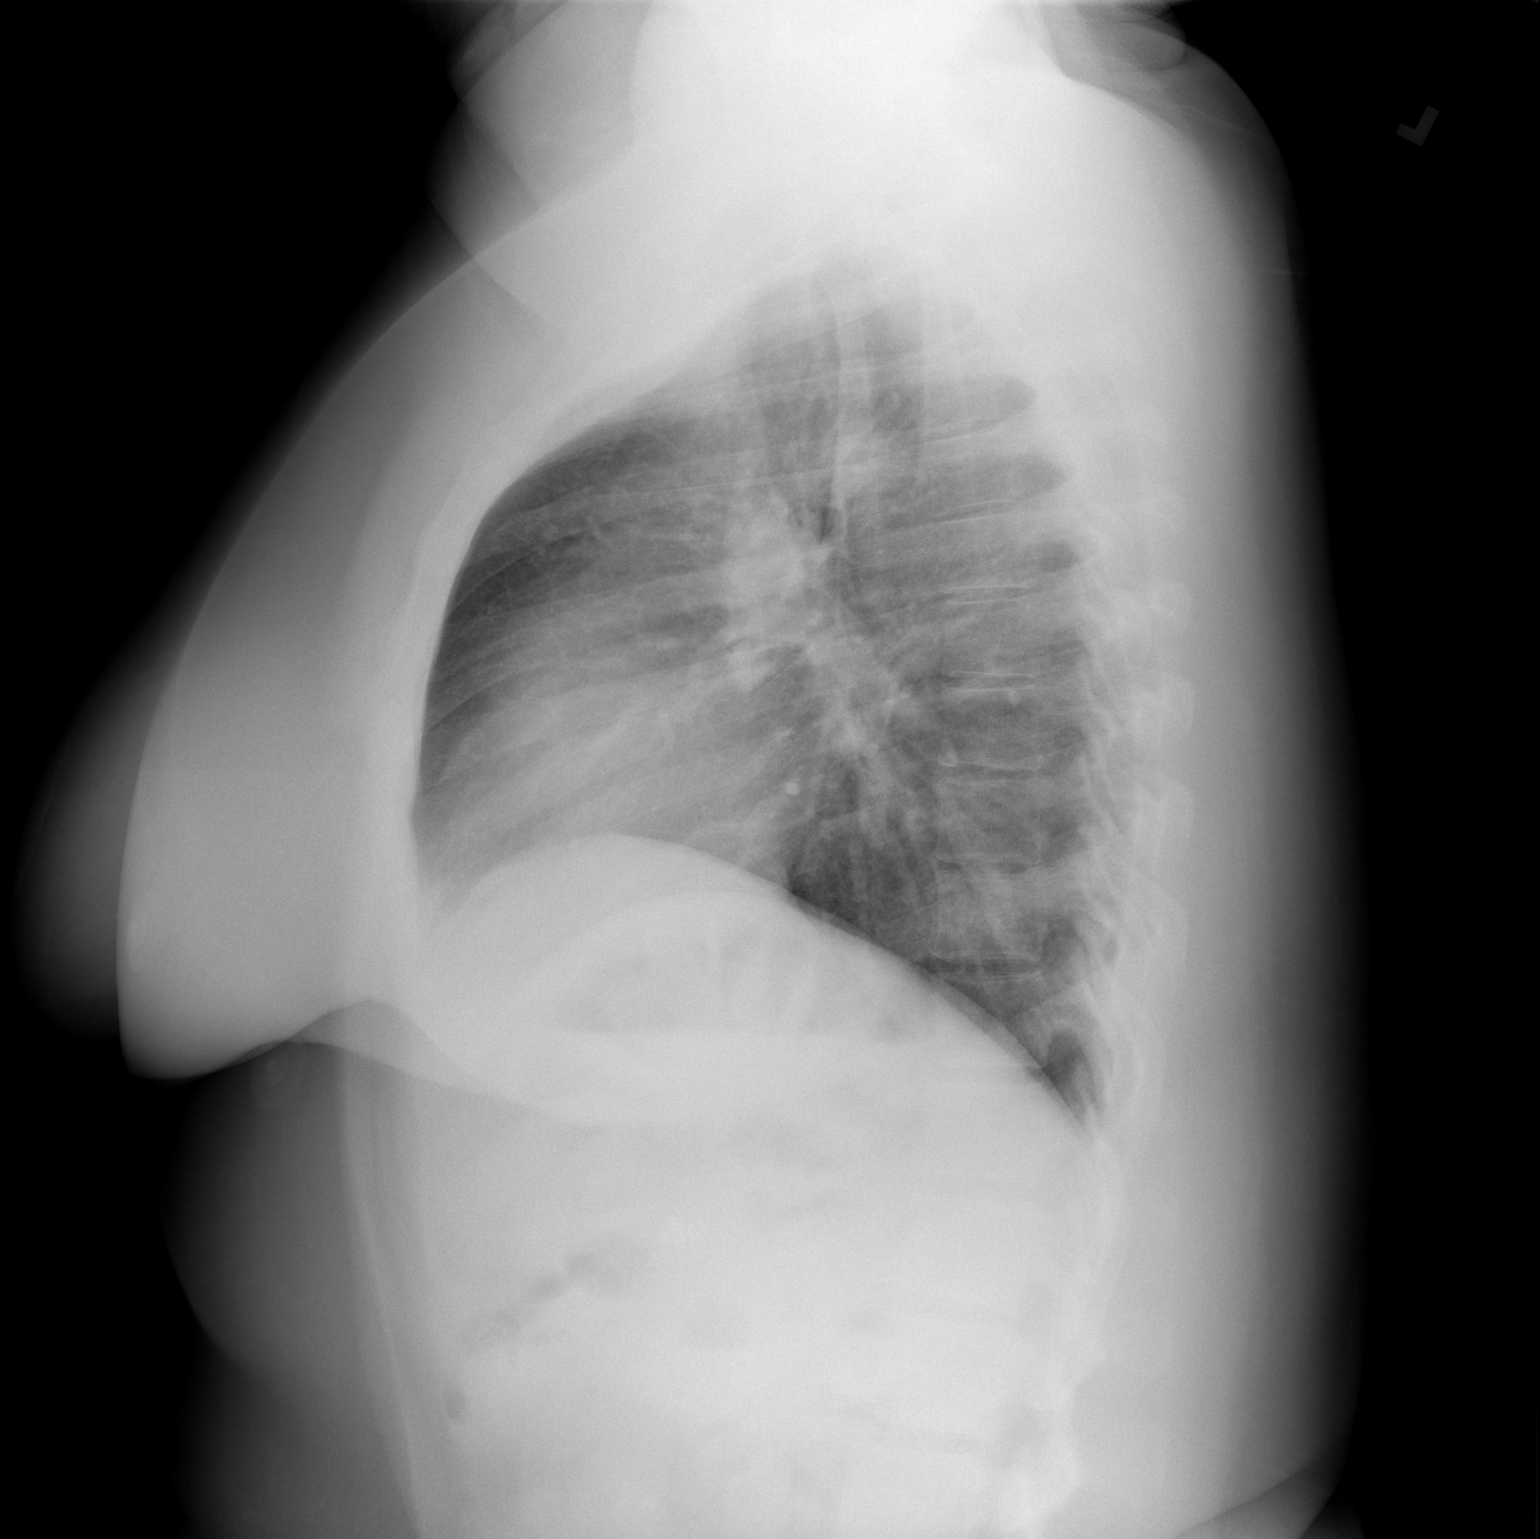

[2 of 2 positions shown; findings below may reference images not displayed]

FINDINGS: Normal heart size, mediastinal contours, and pulmonary vascularity.

Lungs clear.

No pleural effusion or pneumothorax.

Bones unremarkable.
IMPRESSION: Normal exam.

## 2021-12-07 ENCOUNTER — Encounter (HOSPITAL_BASED_OUTPATIENT_CLINIC_OR_DEPARTMENT_OTHER): Payer: Self-pay | Admitting: Urology

## 2021-12-07 ENCOUNTER — Emergency Department (HOSPITAL_BASED_OUTPATIENT_CLINIC_OR_DEPARTMENT_OTHER)
Admission: EM | Admit: 2021-12-07 | Discharge: 2021-12-08 | Disposition: A | Discharge status: Home / Self Care | Payer: Self-pay | Attending: Emergency Medicine | Admitting: Emergency Medicine

## 2021-12-07 DIAGNOSIS — A599 Trichomoniasis, unspecified: Secondary | ICD-10-CM | POA: Insufficient documentation

## 2021-12-07 DIAGNOSIS — R1013 Epigastric pain: Secondary | ICD-10-CM | POA: Insufficient documentation

## 2021-12-07 LAB — CBC WITH DIFFERENTIAL/PLATELET
Abs Immature Granulocytes: 0.04 10*3/uL (ref 0.00–0.07)
Basophils Absolute: 0 10*3/uL (ref 0.0–0.1)
Basophils Relative: 0 %
Eosinophils Absolute: 0 10*3/uL (ref 0.0–0.5)
Eosinophils Relative: 0 %
HCT: 41.9 % (ref 36.0–46.0)
Hemoglobin: 14.1 g/dL (ref 12.0–15.0)
Immature Granulocytes: 0 %
Lymphocytes Relative: 20 %
Lymphs Abs: 1.9 10*3/uL (ref 0.7–4.0)
MCH: 29 pg (ref 26.0–34.0)
MCHC: 33.7 g/dL (ref 30.0–36.0)
MCV: 86 fL (ref 80.0–100.0)
Monocytes Absolute: 0.6 10*3/uL (ref 0.1–1.0)
Monocytes Relative: 6 %
Neutro Abs: 6.9 10*3/uL (ref 1.7–7.7)
Neutrophils Relative %: 74 %
Platelets: 387 10*3/uL (ref 150–400)
RBC: 4.87 MIL/uL (ref 3.87–5.11)
RDW: 13.2 % (ref 11.5–15.5)
WBC: 9.5 10*3/uL (ref 4.0–10.5)
nRBC: 0 % (ref 0.0–0.2)

## 2021-12-07 LAB — URINALYSIS, ROUTINE W REFLEX MICROSCOPIC
Glucose, UA: NEGATIVE mg/dL
Ketones, ur: >=80 mg/dL — AB
Nitrite: NEGATIVE
Protein, ur: 30 mg/dL — AB
Specific Gravity, Urine: >=1.03 (ref 1.005–1.030)
pH: 6 (ref 5.0–8.0)

## 2021-12-07 LAB — URINALYSIS, MICROSCOPIC (REFLEX)

## 2021-12-07 LAB — COMPREHENSIVE METABOLIC PANEL
ALT: 19 U/L (ref 0–44)
AST: 19 U/L (ref 15–41)
Albumin: 4.3 g/dL (ref 3.5–5.0)
Alkaline Phosphatase: 68 U/L (ref 38–126)
Anion gap: 10 (ref 5–15)
BUN: 13 mg/dL (ref 6–20)
CO2: 25 mmol/L (ref 22–32)
Calcium: 9.6 mg/dL (ref 8.9–10.3)
Chloride: 101 mmol/L (ref 98–111)
Creatinine, Ser: 0.83 mg/dL (ref 0.44–1.00)
GFR, Estimated: >60 mL/min (ref >60)
Glucose, Bld: 88 mg/dL (ref 70–99)
Potassium: 3.2 mmol/L — ABNORMAL LOW (ref 3.5–5.1)
Sodium: 136 mmol/L (ref 135–145)
Total Bilirubin: 0.6 mg/dL (ref 0.3–1.2)
Total Protein: 8.7 g/dL — ABNORMAL HIGH (ref 6.5–8.1)

## 2021-12-07 LAB — RAPID URINE DRUG SCREEN, HOSP PERFORMED
Amphetamines: NOT DETECTED
Barbiturates: NOT DETECTED
Benzodiazepines: NOT DETECTED
Cocaine: NOT DETECTED
Opiates: NOT DETECTED
Tetrahydrocannabinol: POSITIVE — AB

## 2021-12-07 LAB — HCG, SERUM, QUALITATIVE: Preg, Serum: NEGATIVE

## 2021-12-07 LAB — LIPASE, BLOOD: Lipase: 35 U/L (ref 11–51)

## 2021-12-07 MED ORDER — LIDOCAINE VISCOUS HCL 2 % MT SOLN
15.0000 mL | Freq: Once | OROMUCOSAL | Status: AC
  Administered 2021-12-07: 15 mL via ORAL
  Filled 2021-12-07: qty 15

## 2021-12-07 MED ORDER — ALUM & MAG HYDROXIDE-SIMETH 200-200-20 MG/5ML PO SUSP
30.0000 mL | Freq: Once | ORAL | Status: AC
  Administered 2021-12-07: 30 mL via ORAL
  Filled 2021-12-07: qty 30

## 2021-12-07 MED ORDER — METRONIDAZOLE 500 MG PO TABS
500.0000 mg | ORAL_TABLET | Freq: Two times a day (BID) | ORAL | 0 refills | Status: DC
Start: 2021-12-07 — End: 2021-12-07

## 2021-12-07 MED ORDER — METRONIDAZOLE 500 MG PO TABS
500.0000 mg | ORAL_TABLET | Freq: Once | ORAL | Status: AC
Start: 2021-12-07 — End: 2021-12-08
  Administered 2021-12-08: 500 mg via ORAL
  Filled 2021-12-07: qty 1

## 2021-12-07 MED ORDER — SODIUM CHLORIDE 0.9 % IV BOLUS
1000.0000 mL | Freq: Once | INTRAVENOUS | Status: AC
  Administered 2021-12-07: 1000 mL via INTRAVENOUS

## 2021-12-07 MED ORDER — PANTOPRAZOLE SODIUM 40 MG IV SOLR
40.0000 mg | Freq: Once | INTRAVENOUS | Status: AC
  Administered 2021-12-07: 40 mg via INTRAVENOUS
  Filled 2021-12-07: qty 10

## 2021-12-07 MED ORDER — SUCRALFATE 1 GM/10ML PO SUSP: 1.0000 g | Freq: Three times a day (TID) | ORAL | 0 refills | Status: DC

## 2021-12-07 MED ORDER — DOXYCYCLINE HYCLATE 100 MG PO CAPS: 100.0000 mg | ORAL_CAPSULE | Freq: Two times a day (BID) | ORAL | 0 refills | Status: DC

## 2021-12-07 MED ORDER — ONDANSETRON 4 MG PO TBDP: 4.0000 mg | ORAL_TABLET | ORAL | 0 refills | Status: DC | PRN

## 2021-12-07 MED ORDER — METRONIDAZOLE 500 MG PO TABS
500.0000 mg | ORAL_TABLET | Freq: Two times a day (BID) | ORAL | 0 refills | Status: DC
Start: 2021-12-07 — End: 2023-01-20

## 2021-12-07 MED ORDER — ONDANSETRON HCL 4 MG/2ML IJ SOLN
4.0000 mg | Freq: Once | INTRAMUSCULAR | Status: AC
  Administered 2021-12-07: 4 mg via INTRAVENOUS
  Filled 2021-12-07: qty 2

## 2021-12-07 MED ORDER — SODIUM CHLORIDE 0.9 % IV SOLN
1.0000 g | Freq: Once | INTRAVENOUS | Status: AC
  Administered 2021-12-08: 1 g via INTRAVENOUS
  Filled 2021-12-07: qty 10

## 2021-12-07 MED ORDER — DOXYCYCLINE HYCLATE 100 MG PO TABS
100.0000 mg | ORAL_TABLET | Freq: Once | ORAL | Status: AC
  Administered 2021-12-08: 100 mg via ORAL
  Filled 2021-12-07: qty 1

## 2021-12-07 NOTE — ED Provider Notes (Signed)
?[facility] EMERGENCY DEPARTMENT ?Provider Note ? ? ?CSN: [id number] ?Arrival date & time: 12/07/21  1847 ? ?  ? ?History ? ?Chief Complaint  ?Patient presents with  ? Heartburn  ? ? ?Victoria Smith is a 31 y.o. female. ? ?HPI ?Patient reports she has a history of bad reflux.  She has been taking her Pepcid and omeprazole but still having symptoms of burning in her upper abdomen and chest.  Patient reports symptoms got pretty bad 3 nights ago and she vomited once.  She reports she is continued to have burning and discomfort since then.  No lower abdominal pain.  No fevers or chills.  No diarrhea.  Patient reports she does take ibuprofen for pain.  She has taken some recently but not repeat dosing. ?  ? ?Home Medications ?Prior to Admission medications   ?Medication Sig Start Date End Date Taking? Authorizing Provider  ?doxycycline (VIBRAMYCIN) 100 MG capsule Take 1 capsule (100 mg total) by mouth 2 (two) times daily. One po bid x 7 days 12/07/21  Yes Miklos Bidinger, Lebron ConnersMarcy, MD  ?ondansetron (ZOFRAN-ODT) 4 MG disintegrating tablet Take 1 tablet (4 mg total) by mouth every 4 (four) hours as needed for nausea or vomiting. 12/07/21  Yes Arby BarrettePfeiffer, Jalyn Rosero, MD  ?sucralfate (CARAFATE) 1 GM/10ML suspension Take 10 mLs (1 g total) by mouth 4 (four) times daily -  with meals and at bedtime. 12/07/21  Yes Arby BarrettePfeiffer, Sheletha Bow, MD  ?famotidine (PEPCID) 20 MG tablet Take 1 tablet (20 mg total) by mouth 2 (two) times daily. 02/28/20   Pricilla LovelessGoldston, Scott, MD  ?metroNIDAZOLE (FLAGYL) 500 MG tablet Take 1 tablet (500 mg total) by mouth 2 (two) times daily. One po bid x 7 days 12/07/21   Arby BarrettePfeiffer, Kitai Purdom, MD  ?omeprazole (PRILOSEC) 40 MG capsule Take 1 capsule (40 mg total) by mouth daily. 02/28/20   Pricilla LovelessGoldston, Scott, MD  ?ondansetron (ZOFRAN ODT) 4 MG disintegrating tablet Take 1 tablet (4 mg total) by mouth every 8 (eight) hours as needed for nausea or vomiting. 10/13/18   Maxwell CaulLayden, Lindsey A, PA-C  ?oxycodone (OXY-IR) 5 MG capsule Take 5 mg by mouth every  4 (four) hours as needed for pain.    [provider]  ?Prenatal Vit-Fe Fumarate-FA (PRENATAL VITAMIN PO) Take by mouth.    [provider]  ?promethazine (PHENERGAN) 25 MG suppository Place 1 suppository (25 mg total) rectally every 6 (six) hours as needed for nausea or vomiting. 11/23/19   Jeannie FendMurphy, Laura A, PA-C  ?sucralfate (CARAFATE) 1 GM/10ML suspension Take 10 mLs (1 g total) by mouth 4 (four) times daily -  with meals and at bedtime. 10/13/18   Maxwell CaulLayden, Lindsey A, PA-C  ?metoCLOPramide (REGLAN) 10 MG tablet Take 1 tablet (10 mg total) by mouth every 8 (eight) hours as needed for nausea or vomiting. ?Patient not taking: Reported on 09/22/2016 04/05/16 02/28/20  Pricilla LovelessGoldston, Scott, MD  ?pantoprazole (PROTONIX) 40 MG tablet Take 1 tablet (40 mg total) by mouth daily. 10/13/18 02/28/20  Graciella FreerLayden, Lindsey A, PA-C  ?potassium chloride SA (K-DUR,KLOR-CON) 20 MEQ tablet Take 1 tablet (20 mEq total) by mouth daily. 04/05/16 02/28/20  Pricilla LovelessGoldston, Scott, MD  ?   ? ?Allergies    ?Patient has no known allergies.   ? ?Review of Systems   ?Review of Systems ?10 systems reviewed and negative except as per HPI ?Physical Exam ?Updated Vital Signs ?BP (!) 143/91   Pulse (!) 53   Temp 98.4 ?F (36.9 ?C) (Oral)   Resp 19  Ht 5\' 4"  (1.626 m)   Wt (!) 142.9 kg   LMP 11/24/2021 (Approximate)   SpO2 98%   BMI 54.07 kg/m?  ?Physical Exam ?Constitutional:   ?   Comments: Alert nontoxic.  Patient is uncomfortable in appearance.  No respiratory distress  ?HENT:  ?   Mouth/Throat:  ?   Pharynx: Oropharynx is clear.  ?Eyes:  ?   Extraocular Movements: Extraocular movements intact.  ?Cardiovascular:  ?   Rate and Rhythm: Normal rate and regular rhythm.  ?Pulmonary:  ?   Effort: Pulmonary effort is normal.  ?   Breath sounds: Normal breath sounds.  ?Abdominal:  ?   Comments: Moderate epigastric tenderness to palpation.  No guarding.  No lower abdominal tenderness to palpation.  ?Musculoskeletal:     ?   General: No swelling. Normal  range of motion.  ?   Right lower leg: No edema.  ?   Left lower leg: No edema.  ?Skin: ?   General: Skin is warm and dry.  ?Neurological:  ?   General: No focal deficit present.  ?   Mental Status: She is oriented to person, place, and time.  ?   Coordination: Coordination normal.  ?Psychiatric:     ?   Mood and Affect: Mood normal.  ? ? ?ED Results / Procedures / Treatments   ?Labs ?(all labs ordered are listed, but only abnormal results are displayed) ?Labs Reviewed  ?COMPREHENSIVE METABOLIC PANEL - Abnormal; Notable for the following components:  ?    Result Value  ? Potassium 3.2 (*)   ? Total Protein 8.7 (*)   ? All other components within normal limits  ?URINALYSIS, ROUTINE W REFLEX MICROSCOPIC - Abnormal; Notable for the following components:  ? APPearance TURBID (*)   ? Hgb urine dipstick MODERATE (*)   ? Bilirubin Urine MODERATE (*)   ? Ketones, ur >=80 (*)   ? Protein, ur 30 (*)   ? Leukocytes,Ua SMALL (*)   ? All other components within normal limits  ?RAPID URINE DRUG SCREEN, HOSP PERFORMED - Abnormal; Notable for the following components:  ? Tetrahydrocannabinol POSITIVE (*)   ? All other components within normal limits  ?URINALYSIS, MICROSCOPIC (REFLEX) - Abnormal; Notable for the following components:  ? Bacteria, UA FEW (*)   ? Trichomonas, UA PRESENT (*)   ? All other components within normal limits  ?URINE CULTURE  ?LIPASE, BLOOD  ?CBC WITH DIFFERENTIAL/PLATELET  ?HCG, SERUM, QUALITATIVE  ?RPR  ?HIV ANTIBODY (ROUTINE TESTING W REFLEX)  ?GC/CHLAMYDIA PROBE AMP ([place]) NOT AT [facility]  ? ? ?EKG ?None ? ?Radiology ?No results found. ? ?Procedures ?Procedures  ? ? ?Medications Ordered in ED ?Medications  ?cefTRIAXone (ROCEPHIN) 1 g in sodium chloride 0.9 % 100 mL IVPB (has no administration in time range)  ?doxycycline (VIBRA-TABS) tablet 100 mg (has no administration in time range)  ?metroNIDAZOLE (FLAGYL) tablet 500 mg (has no administration in time range)  ?sodium chloride 0.9 % bolus 1,000 mL  (1,000 mLs Intravenous New Bag/Given 12/07/21 2321)  ?pantoprazole (PROTONIX) injection 40 mg (40 mg Intravenous Given 12/07/21 2317)  ?ondansetron [facility]) injection 4 mg (4 mg Intravenous Given 12/07/21 2317)  ?alum & mag hydroxide-simeth (MAALOX/MYLANTA) 200-200-20 MG/5ML suspension 30 mL (30 mLs Oral Given 12/07/21 2314)  ?  And  ?lidocaine (XYLOCAINE) 2 % viscous mouth solution 15 mL (15 mLs Oral Given 12/07/21 2314)  ? ? ?ED Course/ Medical Decision Making/ A&P ?  ?                        ?  Medical Decision Making ?Amount and/or Complexity of Data Reviewed ?Labs: ordered. ? ?Risk ?OTC drugs. ?Prescription drug management. ? ? ?Patient reports epigastric pain with a significant mount of burning over the past several days.  Symptoms suggestive of severe gastroesophageal reflux disease.  Patient has already had cholecystectomy.  On exam tenderness is epigastric.  Patient denies any alcohol consumption.  She does take ibuprofen. ? ?Differential diagnosis includes pancreatitis\GERD\gastritis. ? ?Lab results reviewed.  Lipase normal.  LFTs normal.  CBC and differential are normal.  Urinalysis is positive for trichomonas. ? ?Identifying trichomonas and UA, patient does report a new partner about a month ago.  She reports due to concerns about the relationship,she did break that relationship off but suspects that she had STD exposure at that time.  Patient will be treated for STD with Rocephin, Flagyl and doxycycline.  Patient is aware that GC chlamydia test will be pending. ? ?Symptoms are most consistent with GERD.  Patient reports improvement after getting IV Protonix and Zofran.  Recommendations are to follow careful dietary instructions and continue Pepcid and omeprazole daily with the addition of Carafate for the next week. ? ?Patient stable for discharge.  Return precautions reviewed.  Patient is encouraged to follow-up with GYN as soon as possible. ? ? ? ? ? ? ? ? ? ?Final Clinical Impression(s) / ED Diagnoses ?Final  diagnoses:  ?Epigastric pain  ?Trichimoniasis  ? ? ?Rx / DC Orders ?ED Discharge Orders   ? ?      Ordered  ?  doxycycline (VIBRAMYCIN) 100 MG capsule  2 times daily       ? 12/07/21 2350  ?  metroNIDAZOLE (FLAGYL) 5

## 2021-12-07 NOTE — Discharge Instructions (Addendum)
1.  Take antibiotics as prescribed.  No sexual activity until you have completed treatment and partners are tested. ?2.  Continue taking your omeprazole daily and Pepcid twice daily.  Take Carafate as prescribed for the next week.  Follow dietary instructions and gastroesophageal reflux disease instructions, ?3.  Schedule follow-up appointment with your gynecologist within the next 7 to 10 days. ?4.  Return to emergency department if you develop fevers, worsening pain or other concerning symptoms. ?

## 2021-12-07 NOTE — ED Triage Notes (Signed)
Pt states worsening acid reflux over past 2 days  ?Takes prilosec and it isnt working  ?Burning abdominal pain  ?

## 2021-12-08 LAB — RPR: RPR Ser Ql: NONREACTIVE

## 2021-12-08 LAB — HIV ANTIBODY (ROUTINE TESTING W REFLEX): HIV Screen 4th Generation wRfx: NONREACTIVE

## 2021-12-09 LAB — GC/CHLAMYDIA PROBE AMP (~~LOC~~) NOT AT ARMC
Chlamydia: NEGATIVE
Comment: NEGATIVE
Comment: NORMAL
Neisseria Gonorrhea: NEGATIVE

## 2021-12-09 LAB — URINE CULTURE

## 2023-01-20 ENCOUNTER — Emergency Department (HOSPITAL_BASED_OUTPATIENT_CLINIC_OR_DEPARTMENT_OTHER): Payer: Self-pay

## 2023-01-20 ENCOUNTER — Emergency Department (HOSPITAL_BASED_OUTPATIENT_CLINIC_OR_DEPARTMENT_OTHER)
Admission: EM | Admit: 2023-01-20 | Discharge: 2023-01-20 | Disposition: A | Discharge status: Home / Self Care | Payer: Self-pay | Attending: Emergency Medicine | Admitting: Emergency Medicine

## 2023-01-20 ENCOUNTER — Other Ambulatory Visit: Payer: Self-pay

## 2023-01-20 ENCOUNTER — Encounter (HOSPITAL_BASED_OUTPATIENT_CLINIC_OR_DEPARTMENT_OTHER): Payer: Self-pay

## 2023-01-20 DIAGNOSIS — R12 Heartburn: Secondary | ICD-10-CM | POA: Insufficient documentation

## 2023-01-20 DIAGNOSIS — R112 Nausea with vomiting, unspecified: Secondary | ICD-10-CM | POA: Insufficient documentation

## 2023-01-20 DIAGNOSIS — R1013 Epigastric pain: Secondary | ICD-10-CM | POA: Insufficient documentation

## 2023-01-20 DIAGNOSIS — R61 Generalized hyperhidrosis: Secondary | ICD-10-CM | POA: Insufficient documentation

## 2023-01-20 DIAGNOSIS — R1011 Right upper quadrant pain: Secondary | ICD-10-CM | POA: Insufficient documentation

## 2023-01-20 DIAGNOSIS — K219 Gastro-esophageal reflux disease without esophagitis: Secondary | ICD-10-CM

## 2023-01-20 LAB — COMPREHENSIVE METABOLIC PANEL
ALT: 22 U/L (ref 0–44)
AST: 24 U/L (ref 15–41)
Albumin: 3.9 g/dL (ref 3.5–5.0)
Alkaline Phosphatase: 73 U/L (ref 38–126)
Anion gap: 11 (ref 5–15)
BUN: 8 mg/dL (ref 6–20)
CO2: 20 mmol/L — ABNORMAL LOW (ref 22–32)
Calcium: 8.9 mg/dL (ref 8.9–10.3)
Chloride: 105 mmol/L (ref 98–111)
Creatinine, Ser: 0.79 mg/dL (ref 0.44–1.00)
GFR, Estimated: >60 mL/min (ref >60)
Glucose, Bld: 131 mg/dL — ABNORMAL HIGH (ref 70–99)
Potassium: 3.6 mmol/L (ref 3.5–5.1)
Sodium: 136 mmol/L (ref 135–145)
Total Bilirubin: 0.4 mg/dL (ref 0.3–1.2)
Total Protein: 8 g/dL (ref 6.5–8.1)

## 2023-01-20 LAB — CBC
HCT: 41.3 % (ref 36.0–46.0)
Hemoglobin: 13.8 g/dL (ref 12.0–15.0)
MCH: 28.8 pg (ref 26.0–34.0)
MCHC: 33.4 g/dL (ref 30.0–36.0)
MCV: 86 fL (ref 80.0–100.0)
Platelets: 351 10*3/uL (ref 150–400)
RBC: 4.8 MIL/uL (ref 3.87–5.11)
RDW: 13.6 % (ref 11.5–15.5)
WBC: 8.6 10*3/uL (ref 4.0–10.5)
nRBC: 0 % (ref 0.0–0.2)

## 2023-01-20 LAB — URINALYSIS, ROUTINE W REFLEX MICROSCOPIC
Bilirubin Urine: NEGATIVE
Glucose, UA: NEGATIVE mg/dL
Ketones, ur: >=80 mg/dL — AB
Nitrite: NEGATIVE
Protein, ur: 30 mg/dL — AB
Specific Gravity, Urine: >=1.03 (ref 1.005–1.030)
pH: 6 (ref 5.0–8.0)

## 2023-01-20 LAB — RAPID URINE DRUG SCREEN, HOSP PERFORMED
Amphetamines: NOT DETECTED
Barbiturates: NOT DETECTED
Benzodiazepines: NOT DETECTED
Cocaine: NOT DETECTED
Opiates: NOT DETECTED
Tetrahydrocannabinol: POSITIVE — AB

## 2023-01-20 LAB — URINALYSIS, MICROSCOPIC (REFLEX)

## 2023-01-20 LAB — TROPONIN I (HIGH SENSITIVITY)
Troponin I (High Sensitivity): <2 ng/L (ref <18)
Troponin I (High Sensitivity): <2 ng/L (ref <18)

## 2023-01-20 LAB — LIPASE, BLOOD: Lipase: 31 U/L (ref 11–51)

## 2023-01-20 LAB — PREGNANCY, URINE: Preg Test, Ur: NEGATIVE

## 2023-01-20 MED ORDER — SODIUM CHLORIDE 0.9 % IV BOLUS
1000.0000 mL | Freq: Once | INTRAVENOUS | Status: AC
  Administered 2023-01-20: 1000 mL via INTRAVENOUS

## 2023-01-20 MED ORDER — FAMOTIDINE 20 MG PO TABS: 20.0000 mg | ORAL_TABLET | Freq: Two times a day (BID) | ORAL | 0 refills | Status: DC

## 2023-01-20 MED ORDER — OMEPRAZOLE 40 MG PO CPDR: 40.0000 mg | DELAYED_RELEASE_CAPSULE | Freq: Every day | ORAL | 0 refills | Status: DC

## 2023-01-20 MED ORDER — KETOROLAC TROMETHAMINE 30 MG/ML IJ SOLN
15.0000 mg | Freq: Once | INTRAMUSCULAR | Status: AC
Start: 2023-01-20 — End: 2023-01-20
  Administered 2023-01-20: 15 mg via INTRAVENOUS
  Filled 2023-01-20: qty 1

## 2023-01-20 MED ORDER — DROPERIDOL 2.5 MG/ML IJ SOLN
2.5000 mg | Freq: Once | INTRAMUSCULAR | Status: AC
  Administered 2023-01-20: 2.5 mg via INTRAVENOUS
  Filled 2023-01-20: qty 2

## 2023-01-20 MED ORDER — ONDANSETRON HCL 4 MG/2ML IJ SOLN
4.0000 mg | Freq: Once | INTRAMUSCULAR | Status: AC
  Administered 2023-01-20: 4 mg via INTRAVENOUS
  Filled 2023-01-20: qty 2

## 2023-01-20 MED ORDER — ONDANSETRON 4 MG PO TBDP: 4.0000 mg | ORAL_TABLET | Freq: Three times a day (TID) | ORAL | 0 refills | Status: DC | PRN

## 2023-01-20 MED ORDER — METOCLOPRAMIDE HCL 5 MG/ML IJ SOLN
10.0000 mg | Freq: Once | INTRAMUSCULAR | Status: AC
  Administered 2023-01-20: 10 mg via INTRAVENOUS
  Filled 2023-01-20: qty 2

## 2023-01-20 MED ORDER — SUCRALFATE 1 GM/10ML PO SUSP: 1.0000 g | Freq: Three times a day (TID) | ORAL | 0 refills | Status: AC

## 2023-01-20 MED ORDER — PANTOPRAZOLE SODIUM 40 MG IV SOLR
40.0000 mg | Freq: Once | INTRAVENOUS | Status: AC
Start: 2023-01-20 — End: 2023-01-20
  Administered 2023-01-20: 40 mg via INTRAVENOUS
  Filled 2023-01-20: qty 10

## 2023-01-20 MED ORDER — IOHEXOL 300 MG/ML  SOLN
100.0000 mL | Freq: Once | INTRAMUSCULAR | Status: AC | PRN
  Administered 2023-01-20: 100 mL via INTRAVENOUS

## 2023-01-20 MED ORDER — ALUM & MAG HYDROXIDE-SIMETH 200-200-20 MG/5ML PO SUSP
30.0000 mL | Freq: Once | ORAL | Status: AC
  Administered 2023-01-20: 30 mL via ORAL
  Filled 2023-01-20: qty 30

## 2023-01-20 MED ORDER — ONDANSETRON HCL 4 MG/2ML IJ SOLN
4.0000 mg | Freq: Once | INTRAMUSCULAR | Status: AC | PRN
Start: 2023-01-20 — End: 2023-01-20
  Administered 2023-01-20: 4 mg via INTRAVENOUS
  Filled 2023-01-20: qty 2

## 2023-01-20 NOTE — ED Provider Notes (Signed)
[place] EMERGENCY DEPARTMENT AT [facility] Provider Note   CSN: [id number] Arrival date & time: 01/20/23  0435     History  Chief Complaint  Patient presents with   Heartburn    Victoria Smith is a 32 y.o. female.  Patient with a history of severe acid reflux for which she takes omeprazole here with burning epigastric pain radiates up into her chest associate with nausea and vomiting.  States symptoms started around midnight last night.  Developed nauseous with multiple doses of vomiting pain starts in her epigastrium and spreads into her chest.  Similar to previous episodes of acid reflux.  Emesis is nonbloody.  States compliance with her omeprazole.  No fevers, chills, back pain but does have chest pain.  States she is never had an endoscopy.  Because her medications normally control her symptoms well.  Denies any alcohol or NSAID use.  Denies any black or bloody stools or blood in the emesis.  Feels her epigastric pain and chest pain started after dry heaving. Previous cholecystectomy  The history is provided by the patient.  Heartburn Associated symptoms include abdominal pain. Pertinent negatives include no headaches and no shortness of breath.       Home Medications Prior to Admission medications   Medication Sig Start Date End Date Taking? Authorizing Provider  doxycycline (VIBRAMYCIN) 100 MG capsule Take 1 capsule (100 mg total) by mouth 2 (two) times daily. One po bid x 7 days 12/07/21   Arby Barrette, MD  famotidine (PEPCID) 20 MG tablet Take 1 tablet (20 mg total) by mouth 2 (two) times daily. 02/28/20   Pricilla Loveless, MD  metroNIDAZOLE (FLAGYL) 500 MG tablet Take 1 tablet (500 mg total) by mouth 2 (two) times daily. One po bid x 7 days 12/07/21   Arby Barrette, MD  omeprazole (PRILOSEC) 40 MG capsule Take 1 capsule (40 mg total) by mouth daily. 02/28/20   Pricilla Loveless, MD  ondansetron (ZOFRAN ODT) 4 MG disintegrating tablet Take 1 tablet (4 mg total) by  mouth every 8 (eight) hours as needed for nausea or vomiting. 10/13/18   Maxwell Caul, PA-C  ondansetron (ZOFRAN-ODT) 4 MG disintegrating tablet Take 1 tablet (4 mg total) by mouth every 4 (four) hours as needed for nausea or vomiting. 12/07/21   Arby Barrette, MD  oxycodone (OXY-IR) 5 MG capsule Take 5 mg by mouth every 4 (four) hours as needed for pain.    [provider]  Prenatal Vit-Fe Fumarate-FA (PRENATAL VITAMIN PO) Take by mouth.    [provider]  promethazine (PHENERGAN) 25 MG suppository Place 1 suppository (25 mg total) rectally every 6 (six) hours as needed for nausea or vomiting. 11/23/19   Jeannie Fend, PA-C  sucralfate (CARAFATE) 1 GM/10ML suspension Take 10 mLs (1 g total) by mouth 4 (four) times daily -  with meals and at bedtime. 10/13/18   Maxwell Caul, PA-C  sucralfate (CARAFATE) 1 GM/10ML suspension Take 10 mLs (1 g total) by mouth 4 (four) times daily -  with meals and at bedtime. 12/07/21   Arby Barrette, MD  metoCLOPramide (REGLAN) 10 MG tablet Take 1 tablet (10 mg total) by mouth every 8 (eight) hours as needed for nausea or vomiting. Patient not taking: Reported on 09/22/2016 04/05/16 02/28/20  Pricilla Loveless, MD  pantoprazole (PROTONIX) 40 MG tablet Take 1 tablet (40 mg total) by mouth daily. 10/13/18 02/28/20  Graciella Freer A, PA-C  potassium chloride SA (K-DUR,KLOR-CON) 20 MEQ tablet Take 1  tablet (20 mEq total) by mouth daily. 04/05/16 02/28/20  Pricilla Loveless, MD      Allergies    Patient has no known allergies.    Review of Systems   Review of Systems  Constitutional:  Positive for activity change and appetite change. Negative for fever.  HENT:  Negative for congestion.   Respiratory:  Negative for cough, chest tightness and shortness of breath.   Gastrointestinal:  Positive for abdominal pain, heartburn, nausea and vomiting.  Genitourinary:  Negative for dysuria and hematuria.  Musculoskeletal:  Negative for arthralgias and myalgias.   Skin:  Negative for rash.  Neurological:  Negative for dizziness, weakness and headaches.   all other systems are negative except as noted in the HPI and PMH.    Physical Exam Updated Vital Signs BP (!) 141/95   Pulse 65   Temp 98.4 F (36.9 C) (Oral)   Resp 14   Ht 5\' 4"  (1.626 m)   Wt 113.4 kg   LMP 12/25/2022 (Exact Date)   SpO2 100%   BMI 42.91 kg/m  Physical Exam Vitals and nursing note reviewed.  Constitutional:      General: She is not in acute distress.    Appearance: She is well-developed. She is diaphoretic.     Comments: Actively vomiting  HENT:     Head: Normocephalic and atraumatic.     Mouth/Throat:     Pharynx: No oropharyngeal exudate.  Eyes:     Conjunctiva/sclera: Conjunctivae normal.     Pupils: Pupils are equal, round, and reactive to light.  Neck:     Comments: No meningismus. Cardiovascular:     Rate and Rhythm: Normal rate and regular rhythm.     Heart sounds: Normal heart sounds. No murmur heard. Pulmonary:     Effort: Pulmonary effort is normal. No respiratory distress.     Breath sounds: Normal breath sounds.  Abdominal:     Palpations: Abdomen is soft.     Tenderness: There is abdominal tenderness. There is guarding. There is no rebound.     Comments: Epigastric and right upper quadrant tenderness with voluntary guarding  Musculoskeletal:        General: No tenderness. Normal range of motion.     Cervical back: Normal range of motion and neck supple.     Comments: No CVAT  Skin:    General: Skin is warm.  Neurological:     Mental Status: She is alert and oriented to person, place, and time.     Cranial Nerves: No cranial nerve deficit.     Motor: No abnormal muscle tone.     Coordination: Coordination normal.     Comments:  5/5 strength throughout. CN 2-12 intact.Equal grip strength.   Psychiatric:        Behavior: Behavior normal.     ED Results / Procedures / Treatments   Labs (all labs ordered are listed, but only abnormal  results are displayed) Labs Reviewed  COMPREHENSIVE METABOLIC PANEL - Abnormal; Notable for the following components:      Result Value   CO2 20 (*)    Glucose, Bld 131 (*)    All other components within normal limits  LIPASE, BLOOD  CBC  PREGNANCY, URINE  URINALYSIS, ROUTINE W REFLEX MICROSCOPIC  RAPID URINE DRUG SCREEN, HOSP PERFORMED  TROPONIN I (HIGH SENSITIVITY)  TROPONIN I (HIGH SENSITIVITY)    EKG EKG Interpretation  Date/Time:  Saturday January 20 2023 05:02:55 EDT Ventricular Rate:  68 PR Interval:  135 QRS  Duration: 94 QT Interval:  414 QTC Calculation: 441 R Axis:   39 Text Interpretation: Sinus rhythm No significant change was found Confirmed by Glynn Octave [phone]) on 01/20/2023 5:17:54 AM  Radiology No results found.  Procedures Procedures    Medications Ordered in ED Medications  metoCLOPramide (REGLAN) injection 10 mg (has no administration in time range)  alum & mag hydroxide-simeth (MAALOX/MYLANTA) 200-200-20 MG/5ML suspension 30 mL (has no administration in time range)  ondansetron (ZOFRAN) injection 4 mg (4 mg Intravenous Given 01/20/23 0459)  ondansetron (ZOFRAN) injection 4 mg (4 mg Intravenous Given 01/20/23 0544)  sodium chloride 0.9 % bolus 1,000 mL (1,000 mLs Intravenous New Bag/Given 01/20/23 0544)  ketorolac (TORADOL) 30 MG/ML injection 15 mg (15 mg Intravenous Given 01/20/23 0545)  pantoprazole (PROTONIX) injection 40 mg (40 mg Intravenous Given 01/20/23 0546)    ED Course/ Medical Decision Making/ A&P                             Medical Decision Making Amount and/or Complexity of Data Reviewed Labs: ordered. Decision-making details documented in ED Course. Radiology: ordered and independent interpretation performed. Decision-making details documented in ED Course. ECG/medicine tests: ordered and independent interpretation performed. Decision-making details documented in ED Course.  Risk OTC drugs. Prescription drug  management.   Nausea, vomiting, epigastric pain similar to previous episodes of acid reflux.  Vital stable but uncomfortable and diaphoretic.  EKG without acute ischemia.  Patient is diaphoretic and uncomfortable appearing and dry heaving and vomiting.  Abdomen is soft without peritoneal signs but does have epigastric tenderness.  EKG is nonischemic.  He is given IV fluids and antiemetics and pain control.  Labs show no leukocytosis.  LFTs and lipase are normal.  Previous cholecystectomy.  Low suspicion for ACS or PE.  Suspect likely recurrent acid reflux causing her nausea and vomiting despite her PPI use.  She has never had an EGD.  After several rounds of medication she is still having nausea and vomiting.  Will attempt additional therapies as well as obtain CT scan. Care to be transferred at shift change.  Will need referral to GI.        Final Clinical Impression(s) / ED Diagnoses Final diagnoses:  None    Rx / DC Orders ED Discharge Orders     None         Sircharles Holzheimer, Jeannett Senior, MD 01/20/23 [phone]

## 2023-01-20 NOTE — Discharge Instructions (Addendum)
Your CT scan was negative.  Your labs were reassuring.  Your symptoms are likely consistent with bad gastric reflux.  Recommend you follow-up outpatient with a gastroenterologist.

## 2023-01-20 NOTE — ED Provider Notes (Signed)
  Physical Exam  BP 106/66   Pulse 69   Temp 98.4 F (36.9 C) (Oral)   Resp 17   Ht 5\' 4"  (1.626 m)   Wt 113.4 kg   LMP 12/25/2022 (Exact Date)   SpO2 98%   BMI 42.91 kg/m     Procedures  Procedures  ED Course / MDM   Clinical Course as of 01/20/23 0838  Sat Jan 20, 2023  0826 Tetrahydrocannabinol(!): POSITIVE [JL]    Clinical Course User Index [JL] Ernie Avena, MD   Medical Decision Making Amount and/or Complexity of Data Reviewed Labs: ordered. Decision-making details documented in ED Course. Radiology: ordered. ECG/medicine tests: ordered.  Risk OTC drugs. Prescription drug management.   21F, presenting with epigastric pain, n/v. Labs and reassess.   Laboratory evaluation significant for UDS positive for THC.  Urinalysis with 6-10 WBCs, many bacteria, squamous cells present consistent with likely contamination.  Patient without urinary symptoms.  CBC without a leukocytosis or anemia and CMP generally unremarkable, blood glucose 131, bicarbonate 20, anion gap normal at 11.  Troponin less than 2.  Urine pregnancy negative, lipase normal.  CT abdomen pelvis was generally unremarkable: IMPRESSION:  1. No acute findings are noted in the abdomen or pelvis to account  for the patient's symptoms.  2. Colonic diverticulosis without evidence of acute diverticulitis  at this time.  3. Postoperative changes and incidental findings, as above.    The patient was administered the following medications: Medications  ondansetron (ZOFRAN) injection 4 mg (4 mg Intravenous Given 01/20/23 0459)  ondansetron (ZOFRAN) injection 4 mg (4 mg Intravenous Given 01/20/23 0544)  sodium chloride 0.9 % bolus 1,000 mL (0 mLs Intravenous Stopped 01/20/23 0646)  metoCLOPramide (REGLAN) injection 10 mg (10 mg Intravenous Given 01/20/23 0549)  ketorolac (TORADOL) 30 MG/ML injection 15 mg (15 mg Intravenous Given 01/20/23 0545)  pantoprazole (PROTONIX) injection 40 mg (40 mg Intravenous Given  01/20/23 0546)  alum & mag hydroxide-simeth (MAALOX/MYLANTA) 200-200-20 MG/5ML suspension 30 mL (30 mLs Oral Given 01/20/23 0553)  droperidol (INAPSINE) 2.5 MG/ML injection 2.5 mg (2.5 mg Intravenous Given 01/20/23 0727)  iohexol (OMNIPAQUE) 300 MG/ML solution 100 mL (100 mLs Intravenous Contrast Given 01/20/23 0809)    Following these interventions, the patient was feeling symptomatically improved and was notably tolerating oral intake.  I have added on Pepcid to her home regimen and recommended she also take Carafate.  Zofran was prescribed as well.  Patient was advised to follow-up outpatient with gastroenterology for consideration for EGD.  Overall tolerating oral intake, stable for discharge.       Ernie Avena, MD 01/20/23 [phone]

## 2023-01-20 NOTE — ED Notes (Signed)
Patient vomiting. Provider notified

## 2023-01-20 NOTE — ED Triage Notes (Signed)
Pt reports acid reflux that began at 0100 today that is making her nauseous. Pt intermittently moaning. Pt states she takes Omeprazole, but this time it did not work. No vomiting when dry heaving.

## 2024-04-15 ENCOUNTER — Encounter (HOSPITAL_BASED_OUTPATIENT_CLINIC_OR_DEPARTMENT_OTHER): Payer: Self-pay

## 2024-04-15 ENCOUNTER — Emergency Department (HOSPITAL_BASED_OUTPATIENT_CLINIC_OR_DEPARTMENT_OTHER): Payer: Self-pay

## 2024-04-15 ENCOUNTER — Other Ambulatory Visit: Payer: Self-pay

## 2024-04-15 ENCOUNTER — Emergency Department (HOSPITAL_BASED_OUTPATIENT_CLINIC_OR_DEPARTMENT_OTHER)
Admission: EM | Admit: 2024-04-15 | Discharge: 2024-04-15 | Disposition: A | Discharge status: Home / Self Care | Payer: Self-pay | Attending: Emergency Medicine | Admitting: Emergency Medicine

## 2024-04-15 DIAGNOSIS — K219 Gastro-esophageal reflux disease without esophagitis: Secondary | ICD-10-CM | POA: Insufficient documentation

## 2024-04-15 DIAGNOSIS — R1084 Generalized abdominal pain: Secondary | ICD-10-CM

## 2024-04-15 LAB — COMPREHENSIVE METABOLIC PANEL WITH GFR
ALT: 17 U/L (ref 0–44)
AST: 20 U/L (ref 15–41)
Albumin: 4.6 g/dL (ref 3.5–5.0)
Alkaline Phosphatase: 72 U/L (ref 38–126)
Anion gap: 15 (ref 5–15)
BUN: 7 mg/dL (ref 6–20)
CO2: 22 mmol/L (ref 22–32)
Calcium: 10 mg/dL (ref 8.9–10.3)
Chloride: 102 mmol/L (ref 98–111)
Creatinine, Ser: 0.72 mg/dL (ref 0.44–1.00)
GFR, Estimated: >60 mL/min (ref >60)
Glucose, Bld: 114 mg/dL — ABNORMAL HIGH (ref 70–99)
Potassium: 3.8 mmol/L (ref 3.5–5.1)
Sodium: 139 mmol/L (ref 135–145)
Total Bilirubin: 0.4 mg/dL (ref 0.0–1.2)
Total Protein: 8.5 g/dL — ABNORMAL HIGH (ref 6.5–8.1)

## 2024-04-15 LAB — LIPASE, BLOOD: Lipase: 22 U/L (ref 11–51)

## 2024-04-15 LAB — CBC WITH DIFFERENTIAL/PLATELET
Abs Immature Granulocytes: 0.05 K/uL (ref 0.00–0.07)
Basophils Absolute: 0 K/uL (ref 0.0–0.1)
Basophils Relative: 0 %
Eosinophils Absolute: 0 K/uL (ref 0.0–0.5)
Eosinophils Relative: 0 %
HCT: 37.1 % (ref 36.0–46.0)
Hemoglobin: 12.7 g/dL (ref 12.0–15.0)
Immature Granulocytes: 1 %
Lymphocytes Relative: 12 %
Lymphs Abs: 1.2 K/uL (ref 0.7–4.0)
MCH: 29.5 pg (ref 26.0–34.0)
MCHC: 34.2 g/dL (ref 30.0–36.0)
MCV: 86.1 fL (ref 80.0–100.0)
Monocytes Absolute: 0.2 K/uL (ref 0.1–1.0)
Monocytes Relative: 2 %
Neutro Abs: 8.5 K/uL — ABNORMAL HIGH (ref 1.7–7.7)
Neutrophils Relative %: 85 %
Platelets: 356 K/uL (ref 150–400)
RBC: 4.31 MIL/uL (ref 3.87–5.11)
RDW: 13.4 % (ref 11.5–15.5)
WBC: 10 K/uL (ref 4.0–10.5)
nRBC: 0 % (ref 0.0–0.2)

## 2024-04-15 LAB — HCG, SERUM, QUALITATIVE: Preg, Serum: NEGATIVE

## 2024-04-15 LAB — TROPONIN T, HIGH SENSITIVITY: Troponin T High Sensitivity: <15 ng/L (ref 0–19)

## 2024-04-15 MED ORDER — ONDANSETRON 4 MG PO TBDP: 4.0000 mg | ORAL_TABLET | Freq: Three times a day (TID) | ORAL | 0 refills | Status: AC | PRN

## 2024-04-15 MED ORDER — IOHEXOL 300 MG/ML  SOLN
100.0000 mL | Freq: Once | INTRAMUSCULAR | Status: AC | PRN
  Administered 2024-04-15: 100 mL via INTRAVENOUS

## 2024-04-15 MED ORDER — MORPHINE SULFATE (PF) 4 MG/ML IV SOLN
4.0000 mg | Freq: Once | INTRAVENOUS | Status: AC
  Administered 2024-04-15: 4 mg via INTRAVENOUS
  Filled 2024-04-15: qty 1

## 2024-04-15 MED ORDER — SODIUM CHLORIDE 0.9 % IV BOLUS
1000.0000 mL | Freq: Once | INTRAVENOUS | Status: AC
  Administered 2024-04-15: 1000 mL via INTRAVENOUS

## 2024-04-15 MED ORDER — METOCLOPRAMIDE HCL 5 MG/ML IJ SOLN
10.0000 mg | Freq: Once | INTRAMUSCULAR | Status: AC
  Administered 2024-04-15: 10 mg via INTRAVENOUS
  Filled 2024-04-15: qty 2

## 2024-04-15 MED ORDER — PANTOPRAZOLE SODIUM 40 MG IV SOLR
40.0000 mg | Freq: Once | INTRAVENOUS | Status: AC
  Administered 2024-04-15: 40 mg via INTRAVENOUS
  Filled 2024-04-15: qty 10

## 2024-04-15 MED ORDER — ONDANSETRON HCL 4 MG/2ML IJ SOLN
4.0000 mg | Freq: Once | INTRAMUSCULAR | Status: AC
  Administered 2024-04-15: 4 mg via INTRAVENOUS
  Filled 2024-04-15: qty 2

## 2024-04-15 MED ORDER — DIPHENHYDRAMINE HCL 50 MG/ML IJ SOLN
25.0000 mg | Freq: Once | INTRAMUSCULAR | Status: AC
  Administered 2024-04-15: 25 mg via INTRAVENOUS
  Filled 2024-04-15: qty 1

## 2024-04-15 NOTE — ED Triage Notes (Signed)
 C/o GERD with vomiting x 1 day. Denies diarrhea. Burning mid sternal chest pain as well as abdominal pain.

## 2024-04-15 NOTE — ED Provider Notes (Signed)
 [facility] Provider Note   CSN: [id number] Arrival date & time: 04/15/24  9142     Patient presents with: Gastroesophageal Reflux and Emesis   Victoria Smith is a 33 y.o. female with PMHx GERD who presents to ED concerned for acid reflux and vomiting since yesterday.  GERD feels like burning in the center of her chest and is consisted with prior GERD flares.  Patient also endorsing generalized abdominal pain but states that this is a chronic symptom whenever her GERD becomes severe. Patient currently on Omeprazole  daily and is supposed to follow up with GI for an endoscopy, but has had difficulties given lack of insurance.   Patient denies recent sick contact or suspicious food intake.  Denies fever, dyspnea, cough, diarrhea, hematochezia, hematemesis.     Gastroesophageal Reflux  Emesis      Prior to Admission medications   Medication Sig Start Date End Date Taking? Authorizing Provider  ondansetron  (ZOFRAN -ODT) 4 MG disintegrating tablet Take 1 tablet (4 mg total) by mouth every 8 (eight) hours as needed for nausea. 04/15/24  Yes Hoy Fraction F, PA-C  famotidine  (PEPCID ) 20 MG tablet Take 1 tablet (20 mg total) by mouth 2 (two) times daily. 01/20/23   Jerrol Agent, MD  omeprazole  (PRILOSEC) 40 MG capsule Take 1 capsule (40 mg total) by mouth daily. 01/20/23   Jerrol Agent, MD  Prenatal Vit-Fe Fumarate-FA (PRENATAL VITAMIN PO) Take by mouth.    [provider]  promethazine  (PHENERGAN ) 25 MG suppository Place 1 suppository (25 mg total) rectally every 6 (six) hours as needed for nausea or vomiting. 11/23/19   Beverley Leita LABOR, PA-C  sucralfate  (CARAFATE ) 1 GM/10ML suspension Take 10 mLs (1 g total) by mouth 4 (four) times daily -  with meals and at bedtime. 01/20/23   Jerrol Agent, MD  metoCLOPramide  (REGLAN ) 10 MG tablet Take 1 tablet (10 mg total) by mouth every 8 (eight) hours as needed for nausea or vomiting. Patient  not taking: Reported on 09/22/2016 04/05/16 02/28/20  Freddi Hamilton, MD  pantoprazole  (PROTONIX ) 40 MG tablet Take 1 tablet (40 mg total) by mouth daily. 10/13/18 02/28/20  Layden, Lindsey A, PA-C  potassium chloride  SA (K-DUR,KLOR-CON ) 20 MEQ tablet Take 1 tablet (20 mEq total) by mouth daily. 04/05/16 02/28/20  Freddi Hamilton, MD    Allergies: Patient has no known allergies.    Review of Systems  Gastrointestinal:  Positive for vomiting.    Updated Vital Signs BP 119/78   Pulse 66   Temp 98.3 F (36.8 C) (Oral)   Resp (!) 22   Ht 5' 4 (1.626 m)   LMP 03/25/2024   SpO2 96%   BMI 42.91 kg/m   Physical Exam Vitals and nursing note reviewed.  Constitutional:      General: She is not in acute distress.    Appearance: She is not ill-appearing or toxic-appearing.  HENT:     Head: Normocephalic and atraumatic.     Mouth/Throat:     Mouth: Mucous membranes are moist.  Eyes:     General: No scleral icterus.       Right eye: No discharge.        Left eye: No discharge.     Conjunctiva/sclera: Conjunctivae normal.  Cardiovascular:     Rate and Rhythm: Normal rate and regular rhythm.     Pulses: Normal pulses.     Heart sounds: Normal heart sounds. No murmur heard. Pulmonary:     Effort: Pulmonary  effort is normal. No respiratory distress.     Breath sounds: Normal breath sounds. No wheezing, rhonchi or rales.  Abdominal:     General: Abdomen is flat. Bowel sounds are normal. There is no distension.     Palpations: Abdomen is soft. There is no mass.     Tenderness: There is abdominal tenderness.     Comments: Non-focal generalized abdominal tenderness to palpation  Musculoskeletal:     Right lower leg: No edema.     Left lower leg: No edema.  Skin:    General: Skin is warm and dry.     Findings: No rash.  Neurological:     General: No focal deficit present.     Mental Status: She is alert and oriented to person, place, and time. Mental status is at baseline.  Psychiatric:         Mood and Affect: Mood normal.        Behavior: Behavior normal.     (all labs ordered are listed, but only abnormal results are displayed) Labs Reviewed  COMPREHENSIVE METABOLIC PANEL WITH GFR - Abnormal; Notable for the following components:      Result Value   Glucose, Bld 114 (*)    Total Protein 8.5 (*)    All other components within normal limits  CBC WITH DIFFERENTIAL/PLATELET - Abnormal; Notable for the following components:   Neutro Abs 8.5 (*)    All other components within normal limits  LIPASE, BLOOD  HCG, SERUM, QUALITATIVE  TROPONIN T, HIGH SENSITIVITY    EKG: EKG Interpretation Date/Time:  Tuesday April 15 2024 09:14:12 EDT Ventricular Rate:  64 PR Interval:  126 QRS Duration:  93 QT Interval:  417 QTC Calculation: 431 R Axis:   40  Text Interpretation: Sinus rhythm normal no sig chnage from previous Confirmed by Armenta Canning [phone]) on 04/15/2024 10:37:40 AM  Radiology: CT ABDOMEN PELVIS W CONTRAST Result Date: 04/15/2024 CLINICAL DATA:  Abdominal pain EXAM: CT ABDOMEN AND PELVIS WITH CONTRAST TECHNIQUE: Multidetector CT imaging of the abdomen and pelvis was performed using the standard protocol following bolus administration of intravenous contrast. RADIATION DOSE REDUCTION: This exam was performed according to the departmental dose-optimization program which includes automated exposure control, adjustment of the mA and/or kV according to patient size and/or use of iterative reconstruction technique. CONTRAST:  OMNIPAQUE  IOHEXOL  300 MG/ML  SOLN COMPARISON:  CT abdomen pelvis January 20, 2023 FINDINGS: Lower chest: No acute abnormality. Mildly patulous distal esophagus. Hepatobiliary: Hepatic steatosis. Status post cholecystectomy. No focal liver abnormality is seen. No intrahepatic or extrahepatic biliary dilatation. Pancreas: Unremarkable. No pancreatic ductal dilatation or surrounding inflammatory changes. Spleen: Normal in size without focal  abnormality. Adrenals/Urinary Tract: Adrenal glands are unremarkable. Kidneys are normal, without renal calculi, focal lesion, or hydronephrosis. Bladder is unremarkable. Stomach/Bowel: Query small hiatal hernia. Stomach is within normal limits. Appendix appears normal. No periappendiceal fat stranding. (See below) no evidence of bowel wall thickening, distention, or inflammatory changes. A few scattered colonic diverticula without diverticulitis. Vascular/Lymphatic: No significant vascular findings are present. No enlarged abdominal or pelvic lymph nodes. Reproductive: Thickened hypodense endometrium. Correlate with phase of menstruation. There is a fluid containing cystic structure measuring 1.8 x 1.3 cm in right abdomen adjacent to the mid appendix and superolateral to the right ovary (8/75, 2/52), stable to 2024. No fat stranding. Otherwise uterus and bilateral adnexa are unremarkable. Post tubal ligation changes. Other: Fat containing umbilical hernia with a defect measuring 1 cm. No abdominopelvic ascites. Musculoskeletal: No acute  or significant osseous findings. IMPRESSION: Mild hepatomegaly and hepatic steatosis.  No focal liver lesion. Status post cholecystectomy without biliary dilatation. Indeterminate small fluid attenuation /cystic structure in right paraovarian and adjacent to midportion of the appendix, indeterminate may represent a paraovarian cyst. Recommend pelvic ultrasound or MRI for further characterization if clinically warranted. Electronically Signed   By: Megan  Zare M.D.   On: 04/15/2024 13:33     Procedures   Medications Ordered in the ED  ondansetron  (ZOFRAN ) injection 4 mg (4 mg Intravenous Given 04/15/24 0937)  morphine  (PF) 4 MG/ML injection 4 mg (4 mg Intravenous Given 04/15/24 0939)  sodium chloride  0.9 % bolus 1,000 mL (0 mLs Intravenous Stopped 04/15/24 1221)  pantoprazole  (PROTONIX ) injection 40 mg (40 mg Intravenous Given 04/15/24 0939)  metoCLOPramide  (REGLAN ) injection 10  mg (10 mg Intravenous Given 04/15/24 1229)  diphenhydrAMINE  (BENADRYL ) injection 25 mg (25 mg Intravenous Given 04/15/24 1229)  iohexol  (OMNIPAQUE ) 300 MG/ML solution 100 mL (100 mLs Intravenous Contrast Given 04/15/24 1235)                                    Medical Decision Making Amount and/or Complexity of Data Reviewed Labs: ordered. Radiology: ordered.  Risk Prescription drug management.    This patient presents to the ED for concern of abdominal pain, this involves an extensive number of treatment options, and is a complaint that carries with it a high risk of complications and morbidity.  The differential diagnosis includes gastroenteritis, colitis, small bowel obstruction, appendicitis, cholecystitis, pancreatitis, nephrolithiasis, UTI, pyelonephritis   Co morbidities that complicate the patient evaluation  GERD   Additional history obtained:  Additional history obtained from 01/20/2023 CT abdomen/pelvis: Colonic diverticulosis.  S/p cholecystectomy.   Problem List / ED Course / Critical interventions / Medication management  Patient presented for GERD, vomiting, and generalized abdominal pain since yesterday. Physical exam with nonfocal abdominal tenderness to palpation.  Rest of physical exam reassuring.  Patient afebrile with stable vitals. I Ordered, and personally interpreted labs.  hCG negative.  CBC without leukocytosis or anemia.  CMP with mild hyperglycemia at 114.  Lipase within normal limits.  Troponin within normal limits. I ordered imaging studies including CT Abd/Pelvis with contrast: evaluate for structural/surgical etiology of patients' severe abdominal pain.  I independently visualized and interpreted imaging and I agree with the radiologist interpretation of no acute process.  Of note, there does seem to be an incident dental finding on small cystic structure in right paraovarian area.  I shared this finding with patient and she agrees to follow-up with PCP or  OB/GYN for this concern.  Patient symptoms seem to be stemming more from her acid reflux so I doubt this incidental finding is contributing to her symptoms today. I personally viewed and interpreted the EKG/cardiac monitored which showed an underlying rhythm of: Sinus rhythm Shared all results with patient.  Answered all questions.  Provided patient with Benadryl , Reglan , Zofran , morphine , Protonix , and IV fluids.  Patient now feeling better and is tolerating PO  I will prescribe patient some nausea medicines for home use.  Will provide patient with information for the community clinic and OB/GYN.  Patient verbalizes understanding of plan and is ready to go home. I have reviewed the patients home medicines and have made adjustments as needed The patient has been appropriately medically screened and/or stabilized in the ED. I have low suspicion for any other emergent medical condition  which would require further screening, evaluation or treatment in the ED or require inpatient management. At time of discharge the patient is hemodynamically stable and in no acute distress. I have discussed work-up results and diagnosis with patient and answered all questions. Patient is agreeable with discharge plan. We discussed strict return precautions for returning to the emergency department and they verbalized understanding.     Social Determinants of Health:  none       Final diagnoses:  Generalized abdominal pain  Gastroesophageal reflux disease, unspecified whether esophagitis present    ED Discharge Orders          Ordered    ondansetron  (ZOFRAN -ODT) 4 MG disintegrating tablet  Every 8 hours PRN        04/15/24 1353               Hoy Nidia FALCON, [place] 04/15/24 1355    Armenta Canning, MD 04/15/24 1502

## 2024-04-15 NOTE — Discharge Instructions (Addendum)
 As discussed, you will need to follow-up with primary care or OB/GYN for your incidental findings on the CT scan today.  Findings are as follows:   Indeterminate small fluid attenuation /cystic structure in right paraovarian and adjacent to midportion of the appendix, indeterminate may represent a paraovarian cyst.   Seek emergency care experiencing any new or worsening symptoms.

## 2024-04-17 ENCOUNTER — Encounter: Payer: Self-pay | Admitting: Family Medicine

## 2024-04-17 ENCOUNTER — Ambulatory Visit: Payer: Self-pay | Admitting: Family Medicine

## 2024-04-17 VITALS — BP 139/82 | HR 68 | Temp 98.9°F | Ht 64.0 in | Wt 249.0 lb

## 2024-04-17 DIAGNOSIS — K219 Gastro-esophageal reflux disease without esophagitis: Secondary | ICD-10-CM | POA: Insufficient documentation

## 2024-04-17 DIAGNOSIS — N83201 Unspecified ovarian cyst, right side: Secondary | ICD-10-CM | POA: Insufficient documentation

## 2024-04-17 DIAGNOSIS — Z7689 Persons encountering health services in other specified circumstances: Secondary | ICD-10-CM | POA: Insufficient documentation

## 2024-04-17 MED ORDER — PANTOPRAZOLE SODIUM 40 MG PO TBEC
40.0000 mg | DELAYED_RELEASE_TABLET | Freq: Two times a day (BID) | ORAL | 0 refills | Status: AC
Start: 2024-04-17 — End: ?

## 2024-04-17 NOTE — Progress Notes (Signed)
 New Patient Office Visit  Subjective    Patient ID: Victoria Smith, female    DOB: [birth year]  Age: 33 y.o. MRN: [id number]  CC:  Chief Complaint  Patient presents with   Gastroesophageal Reflux    Referral to OBGYN    HPI Victoria Smith presents to establish care with this practice. She is new to me. Recent ED visit for GERD with vomiting.  Prescribed omeprazole  40 mg daily. Symptoms are not well controlled with this as she has been on omeprazole  for years. Watching diet and not eating too close to bedtime and this is not helping. No insurance and cannot see GI specialist.  History of gallbladder removal.    04/15/24: ED GERD and vomiting Omeprazole  40 mg  Supposed to follow-up with GI (no insurance) Abdominal tenderness (none to palpation)  EKG: NSR rate 64 Troponin negative CBC: no anemia.  CMP: normal GFR Lipase: normal  CT abd/pelvis: mild hepatomegaly and steatosis. S/p cholecystectomy Needs GI  Fluid attenuation/cystic structure in right paraovarian.      Outpatient Encounter Medications as of 04/17/2024  Medication Sig   pantoprazole  (PROTONIX ) 40 MG tablet Take 1 tablet (40 mg total) by mouth 2 (two) times daily before a meal.   [DISCONTINUED] omeprazole  (PRILOSEC) 40 MG capsule Take 1 capsule (40 mg total) by mouth daily.   ondansetron  (ZOFRAN -ODT) 4 MG disintegrating tablet Take 1 tablet (4 mg total) by mouth every 8 (eight) hours as needed for nausea. (Patient not taking: Reported on 04/17/2024)   Prenatal Vit-Fe Fumarate-FA (PRENATAL VITAMIN PO) Take by mouth. (Patient not taking: Reported on 04/17/2024)   promethazine  (PHENERGAN ) 25 MG suppository Place 1 suppository (25 mg total) rectally every 6 (six) hours as needed for nausea or vomiting. (Patient not taking: Reported on 04/17/2024)   sucralfate  (CARAFATE ) 1 GM/10ML suspension Take 10 mLs (1 g total) by mouth 4 (four) times daily -  with meals and at bedtime. (Patient not taking: Reported on 04/17/2024)    [DISCONTINUED] famotidine  (PEPCID ) 20 MG tablet Take 1 tablet (20 mg total) by mouth 2 (two) times daily. (Patient not taking: Reported on 04/17/2024)   [DISCONTINUED] metoCLOPramide  (REGLAN ) 10 MG tablet Take 1 tablet (10 mg total) by mouth every 8 (eight) hours as needed for nausea or vomiting. (Patient not taking: Reported on 09/22/2016)   [DISCONTINUED] pantoprazole  (PROTONIX ) 40 MG tablet Take 1 tablet (40 mg total) by mouth daily.   [DISCONTINUED] potassium chloride  SA (K-DUR,KLOR-CON ) 20 MEQ tablet Take 1 tablet (20 mEq total) by mouth daily.   No facility-administered encounter medications on file as of 04/17/2024.    Past Medical History:  Diagnosis Date   GERD (gastroesophageal reflux disease)    Ovarian cyst     Past Surgical History:  Procedure Laterality Date   CESAREAN SECTION     CHOLECYSTECTOMY     TUBAL LIGATION      Family History  Problem Relation Age of Onset   Cancer Mother    Mental retardation Brother     Social History   Socioeconomic History   Marital status: Single    Spouse name: Not on file   Number of children: 3   Years of education: Not on file   Highest education level: Not on file  Occupational History   Not on file  Tobacco Use   Smoking status: Former    Types: E-cigarettes    Start date: 2022    Quit date: 2025    Years since quitting: 0.6   Smokeless  tobacco: Never  Vaping Use   Vaping status: Never Used  Substance and Sexual Activity   Alcohol use: Never   Drug use: Never    Types: Marijuana   Sexual activity: Yes    Birth control/protection: Other-see comments    Comment: Had tubes tied  Other Topics Concern   Not on file  Social History Narrative   Not on file   Social Drivers of Health   Financial Resource Strain: Not on file  Food Insecurity: Not on file  Transportation Needs: Not on file  Physical Activity: Not on file  Stress: Not on file  Social Connections: Not on file  Intimate Partner Violence: Not on file     ROS      Objective    BP 139/82   Pulse 68   Temp 98.9 F (37.2 C) (Oral)   Ht 5' 4 (1.626 m)   Wt 249 lb (112.9 kg)   LMP 03/25/2024 (Exact Date)   SpO2 100%   Breastfeeding No   BMI 42.74 kg/m   Physical Exam Vitals and nursing note reviewed.  Constitutional:      General: She is not in acute distress.    Appearance: Normal appearance.  Cardiovascular:     Rate and Rhythm: Regular rhythm.     Heart sounds: Normal heart sounds.  Pulmonary:     Effort: Pulmonary effort is normal.     Breath sounds: Normal breath sounds.  Skin:    General: Skin is warm and dry.  Neurological:     General: No focal deficit present.     Mental Status: She is alert. Mental status is at baseline.  Psychiatric:        Mood and Affect: Mood normal.        Behavior: Behavior normal.        Thought Content: Thought content normal.        Judgment: Judgment normal.         Assessment & Plan:   Problem List Items Addressed This Visit     Establishing care with new doctor, encounter for - Primary   Gastroesophageal reflux disease   Chronic condition for past 10 years. Has been on multiple PPIs in the past without relief in symptoms. Flares causes her to need to go to the ED. She is self pay and has not been able to see GI specialist. Will place urgent referral with the hopes that she can set up payment plan. There is concern for esophagitis and my need EGD. Pantoprazole  40 mg BID while awaiting GI evaluation. Information provided on foods to avoid with GERD. Recommend elevating to sleep.  Follow-up per specialist.        Relevant Medications   pantoprazole  (PROTONIX ) 40 MG tablet   Other Relevant Orders   Ambulatory referral to Gastroenterology   Cyst of right ovary   Found on CT scan in ED. Referral sent to GYN for evaluation.       Relevant Orders   Ambulatory referral to Gynecology  Agrees with plan of care discussed.  Questions answered.   Return if  symptoms worsen or fail to improve.   Darice JONELLE Brownie, FNP

## 2024-04-17 NOTE — Assessment & Plan Note (Signed)
 Found on CT scan in ED. Referral sent to GYN for evaluation.

## 2024-04-17 NOTE — Assessment & Plan Note (Addendum)
 Chronic condition for past 10 years. Has been on multiple PPIs in the past without relief in symptoms. Flares causes her to need to go to the ED. She is self pay and has not been able to see GI specialist. Will place urgent referral with the hopes that she can set up payment plan. There is concern for esophagitis and my need EGD. Pantoprazole  40 mg BID while awaiting GI evaluation. Information provided on foods to avoid with GERD. Recommend elevating to sleep.  Follow-up per specialist.

## 2024-05-01 ENCOUNTER — Encounter: Payer: Self-pay | Admitting: Family Medicine

## 2024-05-01 ENCOUNTER — Ambulatory Visit: Payer: Self-pay | Admitting: Nurse Practitioner

## 2024-05-01 ENCOUNTER — Encounter: Payer: Self-pay | Admitting: Nurse Practitioner

## 2024-05-01 VITALS — BP 120/82 | HR 77 | Wt 251.0 lb

## 2024-05-01 DIAGNOSIS — N83201 Unspecified ovarian cyst, right side: Secondary | ICD-10-CM

## 2024-05-01 DIAGNOSIS — N920 Excessive and frequent menstruation with regular cycle: Secondary | ICD-10-CM

## 2024-05-01 NOTE — Progress Notes (Signed)
   Acute Office Visit  Subjective:    Patient ID: Victoria Smith, female    DOB: 17-Mar-1991, 33 y.o.   MRN: [id number]   HPI 33 y.o. [id number] presents today as new patient for right ovarian cyst. ER visit 04/15/24 for generalized abdominal pain, GERD, vomiting. Symptoms are better since starting Protonix . CT scan report showed mild hepatomegaly and hepatic steatosis, indeterminate small fluid attenuation/cystic structure in right paraovarian and adjacent to midportion of appendix. No history of ovarian cysts. Not having any lower right sided pain. Complains of very heavy periods since tubal ligation 6-7 years ago. Occur monthly, has heavy bleeding days 3-8 requiring changing of large depends every couple of hours. Normal Hbg in ER. Family history of fibroids.   Patient's last menstrual period was 04/25/2024 (exact date). Period Duration (Days): 7-8 Period Pattern: Regular Menstrual Flow: Heavy Menstrual Control: Other (Comment) (period underwear & depends) Dysmenorrhea: (!) Severe Dysmenorrhea Symptoms: Cramping, Nausea, Diarrhea, Headache  Review of Systems  Constitutional: Negative.   Gastrointestinal:  Negative for abdominal pain, constipation, diarrhea, nausea and vomiting.  Genitourinary:  Positive for menstrual problem.       Objective:    Physical Exam Constitutional:      Appearance: Normal appearance. She is obese.   GU: Not indicated  BP 120/82   Pulse 77   Wt 251 lb (113.9 kg)   LMP 04/25/2024 (Exact Date)   SpO2 99%   BMI 43.08 kg/m  Wt Readings from Last 3 Encounters:  05/01/24 251 lb (113.9 kg)  04/17/24 249 lb (112.9 kg)  01/20/23 250 lb (113.4 kg)        Assessment & Plan:   Problem List Items Addressed This Visit       Endocrine   Cyst of right ovary - Primary   Relevant Orders   US  PELVIS TRANSVAGINAL NON-OB (TV ONLY)   Other Visit Diagnoses       Menorrhagia with regular cycle       Relevant Orders   US  PELVIS TRANSVAGINAL NON-OB (TV ONLY)       Plan: Schedule ultrasound for evaluation of cyst and heavy periods.       Annabella DELENA Shutter DNP, 10:49 AM 05/01/2024

## 2024-05-20 ENCOUNTER — Ambulatory Visit (INDEPENDENT_AMBULATORY_CARE_PROVIDER_SITE_OTHER): Payer: Self-pay

## 2024-05-20 ENCOUNTER — Ambulatory Visit: Payer: Self-pay | Admitting: Nurse Practitioner

## 2024-05-20 DIAGNOSIS — N920 Excessive and frequent menstruation with regular cycle: Secondary | ICD-10-CM

## 2024-05-20 DIAGNOSIS — N83201 Unspecified ovarian cyst, right side: Secondary | ICD-10-CM
